# Patient Record
Sex: Female | Born: 1990 | Race: Black or African American | Hispanic: No | Marital: Single | Smoking: Never smoker
Health system: Southern US, Community
[De-identification: ages and names within clinical notes are randomized; demographics above are authoritative.]

## PROBLEM LIST (undated history)

## (undated) ENCOUNTER — Emergency Department (HOSPITAL_COMMUNITY): Admission: EM | Payer: Medicaid Other | Source: Home / Self Care

## (undated) ENCOUNTER — Inpatient Hospital Stay (HOSPITAL_COMMUNITY): Payer: Self-pay

## (undated) DIAGNOSIS — F32A Depression, unspecified: Secondary | ICD-10-CM

## (undated) DIAGNOSIS — O2240 Hemorrhoids in pregnancy, unspecified trimester: Secondary | ICD-10-CM

## (undated) DIAGNOSIS — F329 Major depressive disorder, single episode, unspecified: Secondary | ICD-10-CM

## (undated) DIAGNOSIS — D649 Anemia, unspecified: Secondary | ICD-10-CM

---
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
## 2012-10-18 DIAGNOSIS — O210 Mild hyperemesis gravidarum: Secondary | ICD-10-CM | POA: Insufficient documentation

## 2012-10-19 ENCOUNTER — Encounter (HOSPITAL_COMMUNITY): Payer: Self-pay | Admitting: Emergency Medicine

## 2012-10-19 ENCOUNTER — Emergency Department (HOSPITAL_COMMUNITY)
Admission: EM | Admit: 2012-10-19 | Discharge: 2012-10-19 | Disposition: A | Discharge status: Home / Self Care | Payer: Medicaid Other | Attending: Emergency Medicine | Admitting: Emergency Medicine

## 2012-10-19 DIAGNOSIS — Z3490 Encounter for supervision of normal pregnancy, unspecified, unspecified trimester: Secondary | ICD-10-CM

## 2012-10-19 HISTORY — DX: Anemia, unspecified: D64.9

## 2012-10-19 LAB — URINALYSIS, ROUTINE W REFLEX MICROSCOPIC
Bilirubin Urine: NEGATIVE
Glucose, UA: NEGATIVE mg/dL
Hgb urine dipstick: NEGATIVE
Leukocytes, UA: NEGATIVE
Nitrite: NEGATIVE
Urobilinogen, UA: 0.2 mg/dL (ref 0.0–1.0)

## 2012-10-19 LAB — POCT PREGNANCY, URINE: Preg Test, Ur: POSITIVE — AB

## 2012-10-19 MED ORDER — ONDANSETRON HCL 4 MG PO TABS: 4.0000 mg | ORAL_TABLET | Freq: Four times a day (QID) | ORAL | Status: DC

## 2012-10-19 NOTE — ED Notes (Signed)
Patient complaining of lower right quadrant pain, nausea, and vomiting. Reports took at home pregnancy test Friday night and had positive result.

## 2012-10-19 NOTE — ED Provider Notes (Signed)
History     CSN: [id number]  Arrival date & time 10/18/12  2343   First MD Initiated Contact with Patient 10/19/12 0117      No chief complaint on file.   (Consider location/radiation/quality/duration/timing/severity/associated sxs/prior treatment) HPI  [name] is a 22 y.o. female who presents to the Emergency Department complaining of  Nausea and morning vomiting x 1 week, positive pregnancy test at home on Friday. LMP 09/03/12.Has had intermittent right lower quadrant sharp pains x 1 day. Denies fever, chills, vaginal discharge, bleeding.  Past Medical History  Diagnosis Date  . Anemia     History reviewed. No pertinent past surgical history.  History reviewed. No pertinent family history.  History  Substance Use Topics  . Smoking status: Never Smoker   . Smokeless tobacco: Not on file  . Alcohol Use: No    OB History    Grav Para Term Preterm Abortions TAB SAB Ect Mult Living                  Review of Systems  Constitutional: Negative for fever.       10 Systems reviewed and are negative for acute change except as noted in the HPI.  HENT: Negative for congestion.   Eyes: Negative for discharge and redness.  Respiratory: Negative for cough and shortness of breath.   Cardiovascular: Negative for chest pain.  Gastrointestinal: Positive for nausea and vomiting. Negative for abdominal pain.  Musculoskeletal: Negative for back pain.  Skin: Negative for rash.  Neurological: Negative for syncope, numbness and headaches.  Psychiatric/Behavioral:       No behavior change.    Allergies  Review of patient's allergies indicates no known allergies.  Home Medications  No current outpatient prescriptions on file.  BP 112/67  Pulse 106  Temp 99.1 F (37.3 C) (Oral)  Resp 16  Ht 5\' 2"  (1.575 m)  Wt 150 lb (68.04 kg)  BMI 27.44 kg/m2  SpO2 100%  LMP 09/03/2012  Physical Exam  Nursing note and vitals reviewed. Constitutional: She is oriented to person,  place, and time. She appears well-developed and well-nourished.       Awake, alert, nontoxic appearance.  HENT:  Head: Atraumatic.  Eyes: Right eye exhibits no discharge. Left eye exhibits no discharge.  Neck: Neck supple.  Cardiovascular: Normal heart sounds.   Pulmonary/Chest: Effort normal and breath sounds normal. She exhibits no tenderness.  Abdominal: Soft. Bowel sounds are normal. There is no tenderness. There is no rebound.  Musculoskeletal: Normal range of motion. She exhibits no tenderness.       Baseline ROM, no obvious new focal weakness.  Neurological: She is alert and oriented to person, place, and time. She has normal reflexes.       Mental status and motor strength appears baseline for patient and situation.  Skin: Skin is warm and dry. No rash noted.  Psychiatric: She has a normal mood and affect.    ED Course  Procedures (including critical care time)  Results for orders placed during the hospital encounter of 10/19/12  URINALYSIS, ROUTINE W REFLEX MICROSCOPIC      Component Value Range   Color, Urine YELLOW  YELLOW   APPearance CLEAR  CLEAR   Specific Gravity, Urine <1.005 (*) 1.005 - 1.030   pH 6.5  5.0 - 8.0   Glucose, UA NEGATIVE  NEGATIVE mg/dL   Hgb urine dipstick NEGATIVE  NEGATIVE   Bilirubin Urine NEGATIVE  NEGATIVE   Ketones, ur NEGATIVE  NEGATIVE mg/dL  Protein, ur NEGATIVE  NEGATIVE mg/dL   Urobilinogen, UA 0.2  0.0 - 1.0 mg/dL   Nitrite NEGATIVE  NEGATIVE   Leukocytes, UA NEGATIVE  NEGATIVE  POCT PREGNANCY, URINE      Component Value Range   Preg Test, Ur POSITIVE (*) NEGATIVE       MDM  Patient with nausea and daily vomiting, positive home pregnancy test confirmed here. She is pleased to be pregnant. Referral to OB/GYN for prenatal care.Pt stable in ED with no significant deterioration in condition.The patient appears reasonably screened and/or stabilized for discharge and I doubt any other medical condition or other [facility] requiring further  screening, evaluation, or treatment in the ED at this time prior to discharge.  MDM Reviewed: nursing note and vitals Interpretation: labs           Nicoletta Dress. Colon Branch, MD 10/19/12 [phone]

## 2012-10-26 ENCOUNTER — Emergency Department (HOSPITAL_COMMUNITY): Payer: Medicaid Other

## 2012-10-26 ENCOUNTER — Emergency Department (HOSPITAL_COMMUNITY)
Admission: EM | Admit: 2012-10-26 | Discharge: 2012-10-26 | Disposition: A | Discharge status: Home / Self Care | Payer: Medicaid Other | Attending: Emergency Medicine | Admitting: Emergency Medicine

## 2012-10-26 ENCOUNTER — Encounter (HOSPITAL_COMMUNITY): Payer: Self-pay | Admitting: Emergency Medicine

## 2012-10-26 DIAGNOSIS — O99891 Other specified diseases and conditions complicating pregnancy: Secondary | ICD-10-CM | POA: Insufficient documentation

## 2012-10-26 DIAGNOSIS — Z862 Personal history of diseases of the blood and blood-forming organs and certain disorders involving the immune mechanism: Secondary | ICD-10-CM | POA: Insufficient documentation

## 2012-10-26 DIAGNOSIS — Z3490 Encounter for supervision of normal pregnancy, unspecified, unspecified trimester: Secondary | ICD-10-CM

## 2012-10-26 DIAGNOSIS — R1030 Lower abdominal pain, unspecified: Secondary | ICD-10-CM

## 2012-10-26 DIAGNOSIS — R112 Nausea with vomiting, unspecified: Secondary | ICD-10-CM

## 2012-10-26 DIAGNOSIS — R197 Diarrhea, unspecified: Secondary | ICD-10-CM

## 2012-10-26 LAB — URINE MICROSCOPIC-ADD ON

## 2012-10-26 LAB — URINALYSIS, ROUTINE W REFLEX MICROSCOPIC
Hgb urine dipstick: NEGATIVE
Ketones, ur: >80 mg/dL — AB
Specific Gravity, Urine: >1.03 — ABNORMAL HIGH (ref 1.005–1.030)
pH: 6.5 (ref 5.0–8.0)

## 2012-10-26 LAB — CBC
HCT: 33.7 % — ABNORMAL LOW (ref 36.0–46.0)
Hemoglobin: 11.4 g/dL — ABNORMAL LOW (ref 12.0–15.0)
MCHC: 33.8 g/dL (ref 30.0–36.0)
MCV: 79.5 fL (ref 78.0–100.0)
Platelets: 222 10*3/uL (ref 150–400)

## 2012-10-26 LAB — TYPE AND SCREEN

## 2012-10-26 LAB — WET PREP, GENITAL: Trich, Wet Prep: NONE SEEN

## 2012-10-26 MED ORDER — ONDANSETRON 4 MG PO TBDP
4.0000 mg | ORAL_TABLET | Freq: Once | ORAL | Status: AC
  Administered 2012-10-26: 4 mg via ORAL
  Filled 2012-10-26: qty 1

## 2012-10-26 MED ORDER — ONDANSETRON 4 MG PO TBDP: 4.0000 mg | ORAL_TABLET | Freq: Three times a day (TID) | ORAL | Status: DC | PRN

## 2012-10-26 MED ORDER — HYDROMORPHONE HCL PF 1 MG/ML IJ SOLN
0.5000 mg | Freq: Once | INTRAMUSCULAR | Status: AC
  Administered 2012-10-26: 0.5 mg via INTRAVENOUS
  Filled 2012-10-26: qty 1

## 2012-10-26 MED ORDER — SODIUM CHLORIDE 0.9 % IV BOLUS (SEPSIS)
1000.0000 mL | Freq: Once | INTRAVENOUS | Status: AC
  Administered 2012-10-26: 1000 mL via INTRAVENOUS

## 2012-10-26 NOTE — ED Notes (Signed)
Pt c/o lower abd pain/diarrhea  today with intermittent n/v x 2 weeks. Pt approximately [redacted] weeks pregnant. Pt has not had appt with obgyn.

## 2012-10-26 NOTE — ED Provider Notes (Signed)
History     CSN: [id number]  Arrival date & time 10/26/12  1323   First MD Initiated Contact with Patient 10/26/12 1427      Chief Complaint  Patient presents with  . Emesis  . Abdominal Pain    (Consider location/radiation/quality/duration/timing/severity/associated sxs/prior treatment) HPI [name] is a 22 y.o. female who presents at about 7 weeks pregnancy, she is G1 P0 A0, she's had intermittent nausea and vomiting x2 weeks and today has had 3 episodes of loose stools. She's also had associated abdominal cramping today. She says her pain is 10/10, located in lower belly, she says it is similar to severe periods. There's been no blood in the stools, no black stools. She has not taken anything for this. She was diagnosed as being pregnant in this emergency department and was given a prescription for Zofran which she did not fill. She's not been eating very much today, she denies any dizziness, chest pain, shortness of breath. He also denies any vaginal bleeding, vaginal discharge, or rush of fluid.  Past Medical History  Diagnosis Date  . Anemia     History reviewed. No pertinent past surgical history.  No family history on file.  History  Substance Use Topics  . Smoking status: Never Smoker   . Smokeless tobacco: Not on file  . Alcohol Use: No    OB History    Grav Para Term Preterm Abortions TAB SAB Ect Mult Living                  Review of Systems At least 10pt or greater review of systems completed and are negative except where specified in the HPI.  Allergies  Review of patient's allergies indicates no known allergies.  Home Medications   Current Outpatient Rx  Name Route Sig Dispense Refill  . ONDANSETRON HCL 4 MG PO TABS Oral Take 1 tablet (4 mg total) by mouth every 6 (six) hours. 12 tablet 0    BP 109/55  Pulse 62  Temp 97.9 F (36.6 C) (Oral)  Resp 16  Ht 5\' 2"  (1.575 m)  Wt 150 lb (68.04 kg)  BMI 27.44 kg/m2  SpO2 100%  LMP  09/03/2012  Physical Exam  Nursing notes reviewed.  Electronic medical record reviewed. VITAL SIGNS:   Filed Vitals:   10/26/12 1400 10/26/12 1403 10/26/12 1716  BP:  109/55 110/54  Pulse:  62 79  Temp:  97.9 F (36.6 C)   TempSrc: Oral Oral   Resp:  16 16  Height:  5\' 2"  (1.575 m)   Weight:  150 lb (68.04 kg)   SpO2:  100% 100%   CONSTITUTIONAL: Awake, oriented, appears non-toxic HENT: Atraumatic, normocephalic, oral mucosa pink and moist, airway patent. Nares patent without drainage. External ears normal. EYES: Conjunctiva clear, EOMI, PERRLA NECK: Trachea midline, non-tender, supple CARDIOVASCULAR: Normal heart rate, Normal rhythm, No murmurs, rubs, gallops PULMONARY/CHEST: Clear to auscultation, no rhonchi, wheezes, or rales. Symmetrical breath sounds. Non-tender. ABDOMINAL: Non-distended, soft, non-tender - no rebound or guarding.  BS normal. NEUROLOGIC: Non-focal, moving all four extremities, no gross sensory or motor deficits. EXTREMITIES: No clubbing, cyanosis, or edema SKIN: Warm, Dry, No erythema, No rash PELVIC EXAM: normal external genitalia, vulva, vagina, cervix, uterus and adnexa. ED Course  Procedures (including critical care time)  Labs Reviewed  URINALYSIS, ROUTINE W REFLEX MICROSCOPIC - Abnormal; Notable for the following:    Specific Gravity, Urine >1.030 (*)     Bilirubin Urine SMALL (*)  Ketones, ur >80 (*)     Protein, ur 30 (*)     All other components within normal limits  CBC - Abnormal; Notable for the following:    Hemoglobin 11.4 (*)     HCT 33.7 (*)     All other components within normal limits  WET PREP, GENITAL - Abnormal; Notable for the following:    Clue Cells Wet Prep HPF POC FEW (*)     WBC, Wet Prep HPF POC FEW (*)     All other components within normal limits  POCT PREGNANCY, URINE - Abnormal; Notable for the following:    Preg Test, Ur POSITIVE (*)     All other components within normal limits  URINE MICROSCOPIC-ADD ON -  Abnormal; Notable for the following:    Squamous Epithelial / LPF MANY (*)     Bacteria, UA FEW (*)     All other components within normal limits  TYPE AND SCREEN  GC/CHLAMYDIA PROBE AMP, GENITAL  URINE CULTURE   US Ob Comp Less 14 Wks  10/26/2012  *RADIOLOGY REPORT*  Clinical Data: First trimester pregnancy.  Abdominal pain and emesis.  OBSTETRIC <14 WK [place] AND TRANSVAGINAL OB US  Technique:  Both transabdominal and transvaginal ultrasound examinations were performed for complete evaluation of the gestation as well as the maternal uterus, adnexal regions, and pelvic cul-de-sac.  Transvaginal technique was performed to assess early pregnancy.  Comparison:  None.  Intrauterine gestational sac:  Visualized/normal in shape. Yolk sac: Visualized. Unfused amnion noted. Embryo: Visualized. Cardiac Activity: Visualized. Heart Rate: 163 bpm  CRL: 15.8  mm  ; 8-week-0-day.            [place] EDC: 06/07/2013  Maternal uterus/adnexae: No evidence of subchorionic hematoma or significant free pelvic fluid.  Both maternal ovaries are visualized.  There is no adnexal mass.  IMPRESSION:  1.  Single live intrauterine gestation with best estimated gestational age of 8-week-0-day. 2.  No fetal, placental or adnexal abnormalities identified.   Original Report Authenticated By: Gerrianne Scale, M.D.    US Ob Transvaginal  10/26/2012  *RADIOLOGY REPORT*  Clinical Data: First trimester pregnancy.  Abdominal pain and emesis.  OBSTETRIC <14 WK [place] AND TRANSVAGINAL OB US  Technique:  Both transabdominal and transvaginal ultrasound examinations were performed for complete evaluation of the gestation as well as the maternal uterus, adnexal regions, and pelvic cul-de-sac.  Transvaginal technique was performed to assess early pregnancy.  Comparison:  None.  Intrauterine gestational sac:  Visualized/normal in shape. Yolk sac: Visualized. Unfused amnion noted. Embryo: Visualized. Cardiac Activity: Visualized. Heart Rate: 163 bpm  CRL:  15.8  mm  ; 8-week-0-day.            [place] EDC: 06/07/2013  Maternal uterus/adnexae: No evidence of subchorionic hematoma or significant free pelvic fluid.  Both maternal ovaries are visualized.  There is no adnexal mass.  IMPRESSION:  1.  Single live intrauterine gestation with best estimated gestational age of 8-week-0-day. 2.  No fetal, placental or adnexal abnormalities identified.   Original Report Authenticated By: Gerrianne Scale, M.D.      1. Lower abdominal pain   2. Normal pregnancy   3. Diarrhea   4. Nausea & vomiting     Medications  ondansetron (ZOFRAN ODT) 4 MG disintegrating tablet (not administered)  ondansetron (ZOFRAN-ODT) disintegrating tablet 4 mg (4 mg Oral Given 10/26/12 1501)  HYDROmorphone (DILAUDID) injection 0.5 mg (0.5 mg Intravenous Given 10/26/12 1633)  sodium chloride 0.9 % bolus  1,000 mL (0 mL Intravenous Stopped 10/26/12 1731)      MDM  Janet Veasey is a 22 y.o. female presenting with likely exacerbation of her morning sickness secondary to pregnancy however she's also had 3 episodes of loose stools - she's had 3 today and she may be mildly dehydrated. May also be gastroenteritis. Without any bleeding or vaginal complaints, do not think this is an abortion. Pelvic exam shows the cervical os closed, no blood in the vault.  Patient has questionable followup since she is "waiting to get Medicaid" so we'll perform ultrasound here to ensure fetal viability - in addition patient's pain was acute onset in her lower abdomen and I think we need to rule out ovarian torsion, TOA, heterotopic/ectopic pregnancy.  Labs are unremarkable, patient is not anemic, urinalysis is consistent with mild dehydration, many squamous epithelial cells consistent with a contaminated sample-white blood cells 11-20 however she has no symptoms of urinary tract infection. Only few bacteria were seen- I do not think this represents an asymptomatic bacteria, and this is likely related to mild  dehydration and insufficient flushing of the urethra.   Ultrasound shows a single live intrauterine gestation gestational age about 8 weeks.  Patient's pain is gone, she is no longer nauseous, we'll send her another prescription for Zofran-this time ODT, given her resources to followup with prenatal care with the [facility] clinic. I have strongly advised her to get her insurance affairs in order or to workout pain and with local OB/GYN. Also advised to start taking prenatal vitamins to include folic acid.  I explained the diagnosis and have given explicit precautions to return to the ER including vaginal bleeding, worsening vaginal pain, fevers or chills, rush of fluid or any other new or worsening symptoms. The patient understands and accepts the medical plan as it's been dictated and I have answered their questions. Discharge instructions concerning home care and prescriptions have been given.  The patient is STABLE and is discharged to home in good condition.             Jones Skene, MD 10/27/12 2336

## 2012-10-26 NOTE — ED Notes (Signed)
Pt states she feels "much better" 

## 2012-10-27 LAB — URINE CULTURE: Colony Count: 8000

## 2012-10-27 LAB — GC/CHLAMYDIA PROBE AMP, GENITAL: Chlamydia, DNA Probe: NEGATIVE

## 2012-11-18 ENCOUNTER — Encounter (HOSPITAL_COMMUNITY): Payer: Self-pay | Admitting: *Deleted

## 2012-12-31 NOTE — L&D Delivery Note (Signed)
Delivery Note At 2:04 AM a viable female was delivered via Vaginal, Spontaneous Delivery (Presentation: ; Occiput Anterior).  APGAR: 8, 9; weight pending.   Placenta status: Intact, Spontaneous.  Cord:  3-vessel with the following complications: none.  Cord pH: n/a  Anesthesia: Epidural  Episiotomy:  Lacerations: none Suture Repair:  n/a Est. Blood Loss (mL):   Mom to postpartum.  Baby to nursery-stable.  Napoleon Form 05/19/2013, 2:17 AM

## 2013-01-04 ENCOUNTER — Emergency Department (HOSPITAL_COMMUNITY)
Admission: EM | Admit: 2013-01-04 | Discharge: 2013-01-04 | Payer: Medicaid Other | Attending: Emergency Medicine | Admitting: Emergency Medicine

## 2013-01-04 ENCOUNTER — Encounter (HOSPITAL_COMMUNITY): Payer: Self-pay | Admitting: *Deleted

## 2013-01-04 DIAGNOSIS — O9989 Other specified diseases and conditions complicating pregnancy, childbirth and the puerperium: Secondary | ICD-10-CM | POA: Insufficient documentation

## 2013-01-04 DIAGNOSIS — S46909A Unspecified injury of unspecified muscle, fascia and tendon at shoulder and upper arm level, unspecified arm, initial encounter: Secondary | ICD-10-CM | POA: Insufficient documentation

## 2013-01-04 DIAGNOSIS — S4980XA Other specified injuries of shoulder and upper arm, unspecified arm, initial encounter: Secondary | ICD-10-CM | POA: Insufficient documentation

## 2013-01-04 DIAGNOSIS — M25519 Pain in unspecified shoulder: Secondary | ICD-10-CM

## 2013-01-04 DIAGNOSIS — S8000XA Contusion of unspecified knee, initial encounter: Secondary | ICD-10-CM | POA: Insufficient documentation

## 2013-01-04 DIAGNOSIS — Z3490 Encounter for supervision of normal pregnancy, unspecified, unspecified trimester: Secondary | ICD-10-CM

## 2013-01-04 MED ORDER — HYDROCODONE-ACETAMINOPHEN 5-325 MG PO TABS: 1.0000 | ORAL_TABLET | Freq: Four times a day (QID) | ORAL | Status: DC | PRN

## 2013-01-04 NOTE — ED Notes (Signed)
RN given message to call [employer] regarding an outstanding warrant for arrest on pt. Informed [employer] pt was in Fast track room 6.
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
## 2013-02-10 ENCOUNTER — Inpatient Hospital Stay (HOSPITAL_COMMUNITY)
Admission: AD | Admit: 2013-02-10 | Discharge: 2013-02-10 | Disposition: A | Discharge status: Home / Self Care | Payer: Medicaid Other | Source: Ambulatory Visit | Attending: Obstetrics & Gynecology | Admitting: Obstetrics & Gynecology

## 2013-02-10 ENCOUNTER — Encounter (HOSPITAL_COMMUNITY): Payer: Self-pay

## 2013-02-10 DIAGNOSIS — O2390 Unspecified genitourinary tract infection in pregnancy, unspecified trimester: Secondary | ICD-10-CM | POA: Insufficient documentation

## 2013-02-10 DIAGNOSIS — B9689 Other specified bacterial agents as the cause of diseases classified elsewhere: Secondary | ICD-10-CM

## 2013-02-10 DIAGNOSIS — N760 Acute vaginitis: Secondary | ICD-10-CM

## 2013-02-10 DIAGNOSIS — O0930 Supervision of pregnancy with insufficient antenatal care, unspecified trimester: Secondary | ICD-10-CM | POA: Insufficient documentation

## 2013-02-10 DIAGNOSIS — R109 Unspecified abdominal pain: Secondary | ICD-10-CM | POA: Insufficient documentation

## 2013-02-10 DIAGNOSIS — A499 Bacterial infection, unspecified: Secondary | ICD-10-CM

## 2013-02-10 DIAGNOSIS — O0932 Supervision of pregnancy with insufficient antenatal care, second trimester: Secondary | ICD-10-CM

## 2013-02-10 LAB — URINALYSIS, ROUTINE W REFLEX MICROSCOPIC
Glucose, UA: NEGATIVE mg/dL
Hgb urine dipstick: NEGATIVE
Leukocytes, UA: NEGATIVE
Protein, ur: NEGATIVE mg/dL
Specific Gravity, Urine: 1.015 (ref 1.005–1.030)

## 2013-02-10 LAB — WET PREP, GENITAL
Trich, Wet Prep: NONE SEEN
Yeast Wet Prep HPF POC: NONE SEEN

## 2013-02-10 MED ORDER — METRONIDAZOLE 500 MG PO TABS: 500.0000 mg | ORAL_TABLET | Freq: Two times a day (BID) | ORAL | Status: AC

## 2013-02-10 NOTE — MAU Provider Note (Signed)
Attestation of Attending Supervision of Obstetric Fellow: Evaluation and management procedures were performed by the Obstetric Fellow under my supervision and collaboration.  I have reviewed the Obstetric Fellow's note and chart, and I agree with the management and plan.  Kriti Katayama, MD, FACOG Attending Obstetrician & Gynecologist Faculty Practice, [facility]    

## 2013-02-10 NOTE — MAU Provider Note (Signed)
History     CSN: [id number]  Arrival date and time: 02/10/13 1652   First Provider Initiated Contact with Patient 02/10/13 1737      Chief Complaint  Patient presents with  . Abdominal Cramping   HPI 23 y.o. G1P0 at [redacted]w[redacted]d with lower abdominal cramping for about a week. No bleeding or discharge. No prenatal care to date but has Medicaid. Feeling baby move. No dysuria, fever or chills.  OB History   Grav Para Term Preterm Abortions TAB SAB Ect Mult Living   1               Past Medical History  Diagnosis Date  . Anemia     History reviewed. No pertinent past surgical history.  History reviewed. No pertinent family history.  History  Substance Use Topics  . Smoking status: Never Smoker   . Smokeless tobacco: Not on file  . Alcohol Use: No    Allergies: No Known Allergies  Prescriptions prior to admission  Medication Sig Dispense Refill  . acetaminophen (TYLENOL) 500 MG tablet Take 1,000 mg by mouth daily as needed. For pain      . Prenatal Vit-Fe Fumarate-FA (PRENATAL MULTIVITAMIN) TABS Take 2 tablets by mouth daily.      . [DISCONTINUED] HYDROcodone-acetaminophen (NORCO/VICODIN) 5-325 MG per tablet Take 1-2 tablets by mouth every 6 (six) hours as needed for pain.  7 tablet  0    Review of Systems  Constitutional: Negative for fever and chills.  Eyes: Negative for blurred vision and double vision.  Respiratory: Negative for cough.   Cardiovascular: Negative for chest pain and palpitations.  Gastrointestinal: Positive for abdominal pain. Negative for nausea, vomiting, diarrhea and constipation.  Genitourinary: Negative for dysuria and urgency.  Musculoskeletal: Negative for back pain.  Neurological: Positive for headaches (mild occasional, relieved by tylenol). Negative for dizziness.   Physical Exam   Blood pressure 119/64, pulse 101, temperature 98 F (36.7 C), temperature source Oral, resp. rate 18, last menstrual period 09/03/2012.  Physical Exam   Constitutional: She is oriented to person, place, and time. She appears well-developed and well-nourished. No distress.  HENT:  Head: Normocephalic and atraumatic.  Eyes: Conjunctivae and EOM are normal.  Neck: Normal range of motion. Neck supple.  Cardiovascular: Normal rate, regular rhythm and normal heart sounds.   Respiratory: Effort normal and breath sounds normal. No respiratory distress.  GI: Soft. Bowel sounds are normal. There is no tenderness. There is no rebound and no guarding.  Gravid: size appropriate for dates  Genitourinary:  Normal external genitalia, normal vagina. Frothy yellow discharge in vagina. Cervix normal, nonparous, long, no CMT. No adnexal tenderness.  Musculoskeletal: Normal range of motion. She exhibits no edema and no tenderness.  Neurological: She is alert and oriented to person, place, and time.  Skin: Skin is warm and dry.  Psychiatric: She has a normal mood and affect.   FHTs:  164 by doppler  Results for orders placed during the hospital encounter of 02/10/13 (from the past 24 hour(s))  URINALYSIS, ROUTINE W REFLEX MICROSCOPIC     Status: None   Collection Time    02/10/13  5:05 PM      Result Value Range   Color, Urine YELLOW  YELLOW   APPearance CLEAR  CLEAR   Specific Gravity, Urine 1.015  1.005 - 1.030   pH 7.0  5.0 - 8.0   Glucose, UA NEGATIVE  NEGATIVE mg/dL   Hgb urine dipstick NEGATIVE  NEGATIVE   Bilirubin Urine  NEGATIVE  NEGATIVE   Ketones, ur NEGATIVE  NEGATIVE mg/dL   Protein, ur NEGATIVE  NEGATIVE mg/dL   Urobilinogen, UA 0.2  0.0 - 1.0 mg/dL   Nitrite NEGATIVE  NEGATIVE   Leukocytes, UA NEGATIVE  NEGATIVE  WET PREP, GENITAL     Status: Abnormal   Collection Time    02/10/13  5:45 PM      Result Value Range   Yeast Wet Prep HPF POC NONE SEEN  NONE SEEN   Trich, Wet Prep NONE SEEN  NONE SEEN   Clue Cells Wet Prep HPF POC MODERATE (*) NONE SEEN   WBC, Wet Prep HPF POC FEW (*) NONE SEEN    MAU Course   Procedures    Assessment and Plan  22 y.o. G1P0 at [redacted]w[redacted]d by 8 week sono with - Crampy lower abdominal pain:  BV, round ligament. Metronidazole - No prenatal care:  Refer for OP ultrasound for growth/anatomy - Has medicaid, needs to be set up for prenatal care. Over 20 weeks - will need to go to [facility].  Patient left before receiving instructions or results.  Napoleon Form, MD 02/10/2013 6:57 PM   Napoleon Form 02/10/2013, 6:52 PM

## 2013-02-10 NOTE — MAU Note (Signed)
Patient is in with c/o intermittent cramping, hemorrhoids and no prenatal care. She denies n/v, vaginal bleeding or abnormal vaginal bleeding.

## 2013-02-11 LAB — GC/CHLAMYDIA PROBE AMP: CT Probe RNA: NEGATIVE

## 2013-03-02 ENCOUNTER — Encounter: Payer: Self-pay | Admitting: *Deleted

## 2013-03-02 ENCOUNTER — Ambulatory Visit (INDEPENDENT_AMBULATORY_CARE_PROVIDER_SITE_OTHER): Payer: Medicaid Other | Admitting: Advanced Practice Midwife

## 2013-03-02 ENCOUNTER — Other Ambulatory Visit: Payer: Self-pay | Admitting: Obstetrics & Gynecology

## 2013-03-02 ENCOUNTER — Encounter: Payer: Self-pay | Admitting: Advanced Practice Midwife

## 2013-03-02 VITALS — BP 109/71 | Temp 98.9°F | Wt 165.2 lb

## 2013-03-02 DIAGNOSIS — Z3400 Encounter for supervision of normal first pregnancy, unspecified trimester: Secondary | ICD-10-CM

## 2013-03-02 DIAGNOSIS — O0930 Supervision of pregnancy with insufficient antenatal care, unspecified trimester: Secondary | ICD-10-CM

## 2013-03-02 DIAGNOSIS — Z3402 Encounter for supervision of normal first pregnancy, second trimester: Secondary | ICD-10-CM

## 2013-03-02 DIAGNOSIS — Z23 Encounter for immunization: Secondary | ICD-10-CM

## 2013-03-02 DIAGNOSIS — O0932 Supervision of pregnancy with insufficient antenatal care, second trimester: Secondary | ICD-10-CM

## 2013-03-02 LAB — POCT URINALYSIS DIP (DEVICE)
Bilirubin Urine: NEGATIVE
Hgb urine dipstick: NEGATIVE
Ketones, ur: NEGATIVE mg/dL
Nitrite: NEGATIVE
Protein, ur: NEGATIVE mg/dL
pH: 8.5 — ABNORMAL HIGH (ref 5.0–8.0)

## 2013-03-02 MED ORDER — TETANUS-DIPHTH-ACELL PERTUSSIS 5-2.5-18.5 LF-MCG/0.5 IM SUSP
0.5000 mL | Freq: Once | INTRAMUSCULAR | Status: AC
  Administered 2013-03-02: 0.5 mL via INTRAMUSCULAR

## 2013-03-02 NOTE — Progress Notes (Signed)
P=92, c/o a little edema in fingers only, c/o pain and pressure in right side of abdomen, Given new patient information. Discussed weight gain. Declines flu shot today, but requests tdap.

## 2013-03-02 NOTE — Progress Notes (Signed)
U/S scheduled 03/05/13 at 8 am.

## 2013-03-02 NOTE — Patient Instructions (Addendum)
Pregnancy - Second Trimester The second trimester of pregnancy (3 to 6 months) is a period of rapid growth for you and your baby. At the end of the sixth month, your baby is about 9 inches long and weighs 1 1/2 pounds. You will begin to feel the baby move between 18 and 20 weeks of the pregnancy. This is called quickening. Weight gain is faster. A clear fluid (colostrum) may leak out of your breasts. You may feel small contractions of the womb (uterus). This is known as false labor or Braxton-Hicks contractions. This is like a practice for labor when the baby is ready to be born. Usually, the problems with morning sickness have usually passed by the end of your first trimester. Some women develop small dark blotches (called cholasma, mask of pregnancy) on their face that usually goes away after the baby is born. Exposure to the sun makes the blotches worse. Acne may also develop in some pregnant women and pregnant women who have acne, may find that it goes away. PRENATAL EXAMS  Blood work may continue to be done during prenatal exams. These tests are done to check on your health and the probable health of your baby. Blood work is used to follow your blood levels (hemoglobin). Anemia (low hemoglobin) is common during pregnancy. Iron and vitamins are given to help prevent this. You will also be checked for diabetes between 24 and 28 weeks of the pregnancy. Some of the previous blood tests may be repeated.  The size of the uterus is measured during each visit. This is to make sure that the baby is continuing to grow properly according to the dates of the pregnancy.  Your blood pressure is checked every prenatal visit. This is to make sure you are not getting toxemia.  Your urine is checked to make sure you do not have an infection, diabetes or protein in the urine.  Your weight is checked often to make sure gains are happening at the suggested rate. This is to ensure that both you and your baby are growing  normally.  Sometimes, an ultrasound is performed to confirm the proper growth and development of the baby. This is a test which bounces harmless sound waves off the baby so your caregiver can more accurately determine due dates. Sometimes, a specialized test is done on the amniotic fluid surrounding the baby. This test is called an amniocentesis. The amniotic fluid is obtained by sticking a needle into the belly (abdomen). This is done to check the chromosomes in instances where there is a concern about possible genetic problems with the baby. It is also sometimes done near the end of pregnancy if an early delivery is required. In this case, it is done to help make sure the baby's lungs are mature enough for the baby to live outside of the womb. CHANGES OCCURING IN THE SECOND TRIMESTER OF PREGNANCY Your body goes through many changes during pregnancy. They vary from person to person. Talk to your caregiver about changes you notice that you are concerned about.  During the second trimester, you will likely have an increase in your appetite. It is normal to have cravings for certain foods. This varies from person to person and pregnancy to pregnancy.  Your lower abdomen will begin to bulge.  You may have to urinate more often because the uterus and baby are pressing on your bladder. It is also common to get more bladder infections during pregnancy (pain with urination). You can help this by   drinking lots of fluids and emptying your bladder before and after intercourse.  You may begin to get stretch marks on your hips, abdomen, and breasts. These are normal changes in the body during pregnancy. There are no exercises or medications to take that prevent this change.  You may begin to develop swollen and bulging veins (varicose veins) in your legs. Wearing support hose, elevating your feet for 15 minutes, 3 to 4 times a day and limiting salt in your diet helps lessen the problem.  Heartburn may develop  as the uterus grows and pushes up against the stomach. Antacids recommended by your caregiver helps with this problem. Also, eating smaller meals 4 to 5 times a day helps.  Constipation can be treated with a stool softener or adding bulk to your diet. Drinking lots of fluids, vegetables, fruits, and whole grains are helpful.  Exercising is also helpful. If you have been very active up until your pregnancy, most of these activities can be continued during your pregnancy. If you have been less active, it is helpful to start an exercise program such as walking.  Hemorrhoids (varicose veins in the rectum) may develop at the end of the second trimester. Warm sitz baths and hemorrhoid cream recommended by your caregiver helps hemorrhoid problems.  Backaches may develop during this time of your pregnancy. Avoid heavy lifting, wear low heal shoes and practice good posture to help with backache problems.  Some pregnant women develop tingling and numbness of their hand and fingers because of swelling and tightening of ligaments in the wrist (carpel tunnel syndrome). This goes away after the baby is born.  As your breasts enlarge, you may have to get a bigger bra. Get a comfortable, cotton, support bra. Do not get a nursing bra until the last month of the pregnancy if you will be nursing the baby.  You may get a dark line from your belly button to the pubic area called the linea nigra.  You may develop rosy cheeks because of increase blood flow to the face.  You may develop spider looking lines of the face, neck, arms and chest. These go away after the baby is born. HOME CARE INSTRUCTIONS   It is extremely important to avoid all smoking, herbs, alcohol, and unprescribed drugs during your pregnancy. These chemicals affect the formation and growth of the baby. Avoid these chemicals throughout the pregnancy to ensure the delivery of a healthy infant.  Most of your home care instructions are the same as  suggested for the first trimester of your pregnancy. Keep your caregiver's appointments. Follow your caregiver's instructions regarding medication use, exercise and diet.  During pregnancy, you are providing food for you and your baby. Continue to eat regular, well-balanced meals. Choose foods such as meat, fish, milk and other low fat dairy products, vegetables, fruits, and whole-grain breads and cereals. Your caregiver will tell you of the ideal weight gain.  A physical sexual relationship may be continued up until near the end of pregnancy if there are no other problems. Problems could include early (premature) leaking of amniotic fluid from the membranes, vaginal bleeding, abdominal pain, or other medical or pregnancy problems.  Exercise regularly if there are no restrictions. Check with your caregiver if you are unsure of the safety of some of your exercises. The greatest weight gain will occur in the last 2 trimesters of pregnancy. Exercise will help you:  Control your weight.  Get you in shape for labor and delivery.  Lose weight   after you have the baby.  Wear a good support or jogging bra for breast tenderness during pregnancy. This may help if worn during sleep. Pads or tissues may be used in the bra if you are leaking colostrum.  Do not use hot tubs, steam rooms or saunas throughout the pregnancy.  Wear your seat belt at all times when driving. This protects you and your baby if you are in an accident.  Avoid raw meat, uncooked cheese, cat litter boxes and soil used by cats. These carry germs that can cause birth defects in the baby.  The second trimester is also a good time to visit your dentist for your dental health if this has not been done yet. Getting your teeth cleaned is OK. Use a soft toothbrush. Brush gently during pregnancy.  It is easier to loose urine during pregnancy. Tightening up and strengthening the pelvic muscles will help with this problem. Practice stopping your  urination while you are going to the bathroom. These are the same muscles you need to strengthen. It is also the muscles you would use as if you were trying to stop from passing gas. You can practice tightening these muscles up 10 times a set and repeating this about 3 times per day. Once you know what muscles to tighten up, do not perform these exercises during urination. It is more likely to contribute to an infection by backing up the urine.  Ask for help if you have financial, counseling or nutritional needs during pregnancy. Your caregiver will be able to offer counseling for these needs as well as refer you for other special needs.  Your skin may become oily. If so, wash your face with mild soap, use non-greasy moisturizer and oil or cream based makeup. MEDICATIONS AND DRUG USE IN PREGNANCY  Take prenatal vitamins as directed. The vitamin should contain 1 milligram of folic acid. Keep all vitamins out of reach of children. Only a couple vitamins or tablets containing iron may be fatal to a baby or young child when ingested.  Avoid use of all medications, including herbs, over-the-counter medications, not prescribed or suggested by your caregiver. Only take over-the-counter or prescription medicines for pain, discomfort, or fever as directed by your caregiver. Do not use aspirin.  Let your caregiver also know about herbs you may be using.  Alcohol is related to a number of birth defects. This includes fetal alcohol syndrome. All alcohol, in any form, should be avoided completely. Smoking will cause low birth rate and premature babies.  Street or illegal drugs are very harmful to the baby. They are absolutely forbidden. A baby born to an addicted mother will be addicted at birth. The baby will go through the same withdrawal an adult does. SEEK MEDICAL CARE IF:  You have any concerns or worries during your pregnancy. It is better to call with your questions if you feel they cannot wait, rather  than worry about them. SEEK IMMEDIATE MEDICAL CARE IF:   An unexplained oral temperature above 102 F (38.9 C) develops, or as your caregiver suggests.  You have leaking of fluid from the vagina (birth canal). If leaking membranes are suspected, take your temperature and tell your caregiver of this when you call.  There is vaginal spotting, bleeding, or passing clots. Tell your caregiver of the amount and how many pads are used. Light spotting in pregnancy is common, especially following intercourse.  You develop a bad smelling vaginal discharge with a change in the color from clear   to white.  You continue to feel sick to your stomach (nauseated) and have no relief from remedies suggested. You vomit blood or coffee ground-like materials.  You lose more than 2 pounds of weight or gain more than 2 pounds of weight over 1 week, or as suggested by your caregiver.  You notice swelling of your face, hands, feet, or legs.  You get exposed to German measles and have never had them.  You are exposed to fifth disease or chickenpox.  You develop belly (abdominal) pain. Round ligament discomfort is a common non-cancerous (benign) cause of abdominal pain in pregnancy. Your caregiver still must evaluate you.  You develop a bad headache that does not go away.  You develop fever, diarrhea, pain with urination, or shortness of breath.  You develop visual problems, blurry, or double vision.  You fall or are in a car accident or any kind of trauma.  There is mental or physical violence at home. Document Released: 12/11/2001 Document Revised: 03/10/2012 Document Reviewed: 06/15/2009 [employer] Patient Information 2013 [employer].  

## 2013-03-03 LAB — OBSTETRIC PANEL
Antibody Screen: NEGATIVE
Basophils Absolute: 0 10*3/uL (ref 0.0–0.1)
Basophils Relative: 0 % (ref 0–1)
Eosinophils Absolute: 0.1 10*3/uL (ref 0.0–0.7)
HCT: 29.5 % — ABNORMAL LOW (ref 36.0–46.0)
Hemoglobin: 9.8 g/dL — ABNORMAL LOW (ref 12.0–15.0)
MCH: 26.8 pg (ref 26.0–34.0)
MCHC: 33.2 g/dL (ref 30.0–36.0)
Monocytes Absolute: 0.6 10*3/uL (ref 0.1–1.0)
Monocytes Relative: 5 % (ref 3–12)
RDW: 14.3 % (ref 11.5–15.5)
Rh Type: POSITIVE

## 2013-03-03 LAB — GLUCOSE TOLERANCE, 1 HOUR (50G) W/O FASTING: Glucose, 1 Hour GTT: 77 mg/dL (ref 70–140)

## 2013-03-03 LAB — HIV ANTIBODY (ROUTINE TESTING W REFLEX): HIV: NONREACTIVE

## 2013-03-04 LAB — HEMOGLOBINOPATHY EVALUATION
Hemoglobin Other: 0 %
Hgb A: 97.2 % (ref 96.8–97.8)

## 2013-03-05 ENCOUNTER — Ambulatory Visit (HOSPITAL_COMMUNITY)
Admission: RE | Admit: 2013-03-05 | Discharge: 2013-03-05 | Disposition: A | Discharge status: Home / Self Care | Payer: Medicaid Other | Source: Ambulatory Visit | Attending: Family Medicine | Admitting: Family Medicine

## 2013-03-05 DIAGNOSIS — O0932 Supervision of pregnancy with insufficient antenatal care, second trimester: Secondary | ICD-10-CM

## 2013-03-05 DIAGNOSIS — Z363 Encounter for antenatal screening for malformations: Secondary | ICD-10-CM | POA: Insufficient documentation

## 2013-03-05 DIAGNOSIS — O358XX0 Maternal care for other (suspected) fetal abnormality and damage, not applicable or unspecified: Secondary | ICD-10-CM | POA: Insufficient documentation

## 2013-03-05 DIAGNOSIS — Z1389 Encounter for screening for other disorder: Secondary | ICD-10-CM | POA: Insufficient documentation

## 2013-03-05 DIAGNOSIS — O0930 Supervision of pregnancy with insufficient antenatal care, unspecified trimester: Secondary | ICD-10-CM | POA: Insufficient documentation

## 2013-03-06 ENCOUNTER — Other Ambulatory Visit: Payer: Self-pay | Admitting: Advanced Practice Midwife

## 2013-03-06 DIAGNOSIS — O0932 Supervision of pregnancy with insufficient antenatal care, second trimester: Secondary | ICD-10-CM | POA: Insufficient documentation

## 2013-03-06 DIAGNOSIS — Z3400 Encounter for supervision of normal first pregnancy, unspecified trimester: Secondary | ICD-10-CM | POA: Insufficient documentation

## 2013-03-06 MED ORDER — FERROUS SULFATE 325 (65 FE) MG PO TABS: 325.0000 mg | ORAL_TABLET | Freq: Three times a day (TID) | ORAL | Status: DC

## 2013-03-06 NOTE — Progress Notes (Signed)
   Subjective:    [name] is a G1P0 [redacted]w[redacted]d being seen today for her first obstetrical visit. Patient does intend to breast feed. Pregnancy history fully reviewed.  Patient reports mild swelling in fingers, some pressure in lower right abdomen with movement.  Filed Vitals:   03/02/13 1010  BP: 109/71  Temp: 98.9 F (37.2 C)  Weight: 165 lb 3.2 oz (74.934 kg)    HISTORY: OB History   Grav Para Term Preterm Abortions TAB SAB Ect Mult Living   1              # Outc Date GA Lbr Len/2nd Wgt Sex Del Anes PTL Lv   1 CUR              Past Medical History  Diagnosis Date  . Anemia    Past Surgical History  Procedure Laterality Date  . Wisdom tooth extraction  2011   Family History  Problem Relation Age of Onset  . Asthma Sister   . Asthma Brother      Exam    Uterus:   25 week size  Pelvic Exam: Deferred, recent pelvic in MAU, not due for pap  System: Breast:  normal appearance, no masses or tenderness   Skin: normal coloration and turgor, no rashes    Neurologic: oriented, normal   Extremities: normal strength, tone, and muscle mass       Mouth/Teeth dental hygiene good       Cardiovascular: regular rate and rhythm   Respiratory:  appears well, vitals normal, no respiratory distress, acyanotic, normal RR   Abdomen: soft, non-tender; bowel sounds normal; no masses,  no organomegaly    Assessment:    Pregnancy: G1P0 Patient Active Problem List  Diagnosis  . Supervision of normal intrauterine pregnancy in primigravida        Plan:     Initial labs drawn. 1 hour GCT today. Prenatal vitamins. Problem list reviewed and updated. Genetic Screening: too late  Ultrasound discussed; fetal survey: ordered.  Follow up in 4 weeks.  FRAZIER,NATALIE 03/06/2013

## 2013-03-10 NOTE — Progress Notes (Signed)
Called Ranee's mother @ [phone] as there is no tel # for [place]. Message left to have [place] call our office.

## 2013-03-12 NOTE — Progress Notes (Signed)
Called patient at listed home number- message stated the [employer] customer you are trying to call is unavailable either because the phone is off or outside of the service area.

## 2013-03-16 ENCOUNTER — Telehealth: Payer: Self-pay | Admitting: General Practice

## 2013-03-16 NOTE — Progress Notes (Signed)
Called patient, heard message "the [employer] customer you are trying to reach is unavailable either because the phone is off or because they are outside of the service area, please try your call again later"

## 2013-03-16 NOTE — Telephone Encounter (Deleted)
Called patient, heard message "the [employer] customer you are trying to reach is unavailable either because the phone is off or because they are outside of the service area, please try your call again later"  

## 2013-03-17 ENCOUNTER — Encounter: Payer: Self-pay | Admitting: *Deleted

## 2013-03-17 NOTE — Progress Notes (Signed)
Certified letter sent to pt regarding test result indicating anemia and need for supplemental iron.

## 2013-03-25 NOTE — Telephone Encounter (Signed)
Opened in error

## 2013-03-30 ENCOUNTER — Ambulatory Visit (INDEPENDENT_AMBULATORY_CARE_PROVIDER_SITE_OTHER): Payer: Medicaid Other | Admitting: Obstetrics and Gynecology

## 2013-03-30 VITALS — BP 122/75 | Wt 173.4 lb

## 2013-03-30 DIAGNOSIS — J069 Acute upper respiratory infection, unspecified: Secondary | ICD-10-CM

## 2013-03-30 DIAGNOSIS — O0930 Supervision of pregnancy with insufficient antenatal care, unspecified trimester: Secondary | ICD-10-CM

## 2013-03-30 DIAGNOSIS — D509 Iron deficiency anemia, unspecified: Secondary | ICD-10-CM

## 2013-03-30 LAB — POCT URINALYSIS DIP (DEVICE)
Hgb urine dipstick: NEGATIVE
Ketones, ur: NEGATIVE mg/dL
Protein, ur: NEGATIVE mg/dL
Specific Gravity, Urine: 1.02 (ref 1.005–1.030)
pH: 7 (ref 5.0–8.0)

## 2013-03-30 NOTE — Progress Notes (Signed)
Has URI and nonproductive cough. Sleepy from cold med. Lungs CTA. Neosynephrine nose gtts. Rx. Not yet on iron> start. Has quit work. Vaginal nonirritative.  Recommended PN classes.

## 2013-03-30 NOTE — Progress Notes (Signed)
Pulse: 97 Had thin milky discharge a few days ago.

## 2013-04-13 ENCOUNTER — Encounter: Payer: Medicaid Other | Admitting: Family Medicine

## 2013-04-14 ENCOUNTER — Encounter: Payer: Self-pay | Admitting: Family Medicine

## 2013-04-20 ENCOUNTER — Encounter: Payer: Self-pay | Admitting: Sports Medicine

## 2013-04-20 ENCOUNTER — Inpatient Hospital Stay (HOSPITAL_COMMUNITY)
Admission: AD | Admit: 2013-04-20 | Discharge: 2013-04-20 | Disposition: A | Discharge status: Home / Self Care | Payer: Medicaid Other | Source: Ambulatory Visit | Attending: Obstetrics and Gynecology | Admitting: Obstetrics and Gynecology

## 2013-04-20 ENCOUNTER — Encounter (HOSPITAL_COMMUNITY): Payer: Self-pay | Admitting: *Deleted

## 2013-04-20 DIAGNOSIS — O4703 False labor before 37 completed weeks of gestation, third trimester: Secondary | ICD-10-CM

## 2013-04-20 DIAGNOSIS — Y92009 Unspecified place in unspecified non-institutional (private) residence as the place of occurrence of the external cause: Secondary | ICD-10-CM | POA: Insufficient documentation

## 2013-04-20 DIAGNOSIS — O4700 False labor before 37 completed weeks of gestation, unspecified trimester: Secondary | ICD-10-CM | POA: Insufficient documentation

## 2013-04-20 DIAGNOSIS — O0933 Supervision of pregnancy with insufficient antenatal care, third trimester: Secondary | ICD-10-CM

## 2013-04-20 DIAGNOSIS — M545 Low back pain, unspecified: Secondary | ICD-10-CM | POA: Insufficient documentation

## 2013-04-20 DIAGNOSIS — R000 Tachycardia, unspecified: Secondary | ICD-10-CM

## 2013-04-20 DIAGNOSIS — M25519 Pain in unspecified shoulder: Secondary | ICD-10-CM | POA: Insufficient documentation

## 2013-04-20 LAB — RAPID URINE DRUG SCREEN, HOSP PERFORMED
Amphetamines: NOT DETECTED
Barbiturates: NOT DETECTED
Opiates: NOT DETECTED
Tetrahydrocannabinol: POSITIVE — AB

## 2013-04-20 LAB — URINE MICROSCOPIC-ADD ON

## 2013-04-20 LAB — URINALYSIS, ROUTINE W REFLEX MICROSCOPIC
Protein, ur: 100 mg/dL — AB
Urobilinogen, UA: 0.2 mg/dL (ref 0.0–1.0)

## 2013-04-20 LAB — WET PREP, GENITAL: Yeast Wet Prep HPF POC: NONE SEEN

## 2013-04-20 MED ORDER — CYCLOBENZAPRINE HCL 10 MG PO TABS: 10.0000 mg | ORAL_TABLET | Freq: Three times a day (TID) | ORAL | Status: DC | PRN

## 2013-04-20 MED ORDER — ONDANSETRON 4 MG PO TBDP
4.0000 mg | ORAL_TABLET | Freq: Once | ORAL | Status: AC
  Administered 2013-04-20: 4 mg via ORAL
  Filled 2013-04-20: qty 1

## 2013-04-20 MED ORDER — NIFEDIPINE 10 MG PO CAPS
10.0000 mg | ORAL_CAPSULE | Freq: Three times a day (TID) | ORAL | Status: DC
  Administered 2013-04-20: 10 mg via ORAL
  Filled 2013-04-20: qty 1

## 2013-04-20 MED ORDER — NIFEDIPINE 10 MG PO CAPS
10.0000 mg | ORAL_CAPSULE | ORAL | Status: AC | PRN
  Administered 2013-04-20 (×2): 10 mg via ORAL
  Filled 2013-04-20 (×2): qty 1

## 2013-04-20 MED ORDER — CYCLOBENZAPRINE HCL 10 MG PO TABS
10.0000 mg | ORAL_TABLET | Freq: Three times a day (TID) | ORAL | Status: DC | PRN
  Administered 2013-04-20: 10 mg via ORAL
  Filled 2013-04-20: qty 1

## 2013-04-20 NOTE — MAU Provider Note (Signed)
Attestation of Attending Supervision of Advanced Practitioner (CNM/NP): Evaluation and management procedures were performed by the Advanced Practitioner under my supervision and collaboration.  I have reviewed the Advanced Practitioner's note and chart, and I agree with the management and plan.  Nyeli Holtmeyer 04/20/2013 7:36 PM
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
## 2013-04-21 LAB — GC/CHLAMYDIA PROBE AMP
CT Probe RNA: NEGATIVE
GC Probe RNA: NEGATIVE

## 2013-05-04 ENCOUNTER — Ambulatory Visit (INDEPENDENT_AMBULATORY_CARE_PROVIDER_SITE_OTHER): Payer: Medicaid Other | Admitting: Obstetrics and Gynecology

## 2013-05-04 ENCOUNTER — Other Ambulatory Visit: Payer: Self-pay | Admitting: Obstetrics and Gynecology

## 2013-05-04 ENCOUNTER — Encounter: Payer: Self-pay | Admitting: Obstetrics and Gynecology

## 2013-05-04 VITALS — BP 126/57 | Wt 170.0 lb

## 2013-05-04 DIAGNOSIS — O0930 Supervision of pregnancy with insufficient antenatal care, unspecified trimester: Secondary | ICD-10-CM

## 2013-05-04 DIAGNOSIS — D509 Iron deficiency anemia, unspecified: Secondary | ICD-10-CM

## 2013-05-04 DIAGNOSIS — O0933 Supervision of pregnancy with insufficient antenatal care, third trimester: Secondary | ICD-10-CM

## 2013-05-04 DIAGNOSIS — Z3403 Encounter for supervision of normal first pregnancy, third trimester: Secondary | ICD-10-CM

## 2013-05-04 LAB — POCT URINALYSIS DIP (DEVICE)
Glucose, UA: NEGATIVE mg/dL
Hgb urine dipstick: NEGATIVE
Nitrite: NEGATIVE
Urobilinogen, UA: 0.2 mg/dL (ref 0.0–1.0)
pH: 8.5 — ABNORMAL HIGH (ref 5.0–8.0)

## 2013-05-04 NOTE — Progress Notes (Signed)
Patient doing well without complaints. FM/PTL precautions reviewed. Cultures next visit

## 2013-05-04 NOTE — Progress Notes (Signed)
Pulse: 86

## 2013-05-13 ENCOUNTER — Encounter: Payer: Medicaid Other | Admitting: Advanced Practice Midwife

## 2013-05-18 ENCOUNTER — Inpatient Hospital Stay (HOSPITAL_COMMUNITY)
Admission: AD | Admit: 2013-05-18 | Discharge: 2013-05-18 | Disposition: A | Discharge status: Home / Self Care | Payer: Medicaid Other | Source: Ambulatory Visit | Attending: Obstetrics & Gynecology | Admitting: Obstetrics & Gynecology

## 2013-05-18 ENCOUNTER — Inpatient Hospital Stay (HOSPITAL_COMMUNITY)
Admission: AD | Admit: 2013-05-18 | Discharge: 2013-05-20 | DRG: 775 | Disposition: A | Discharge status: Home / Self Care | Payer: Medicaid Other | Source: Ambulatory Visit | Attending: Obstetrics & Gynecology | Admitting: Obstetrics & Gynecology

## 2013-05-18 ENCOUNTER — Encounter (HOSPITAL_COMMUNITY): Payer: Self-pay | Admitting: Anesthesiology

## 2013-05-18 ENCOUNTER — Inpatient Hospital Stay (HOSPITAL_COMMUNITY): Payer: Medicaid Other | Admitting: Anesthesiology

## 2013-05-18 ENCOUNTER — Encounter (HOSPITAL_COMMUNITY): Payer: Self-pay | Admitting: *Deleted

## 2013-05-18 DIAGNOSIS — IMO0001 Reserved for inherently not codable concepts without codable children: Secondary | ICD-10-CM

## 2013-05-18 DIAGNOSIS — O0933 Supervision of pregnancy with insufficient antenatal care, third trimester: Secondary | ICD-10-CM

## 2013-05-18 DIAGNOSIS — Z3403 Encounter for supervision of normal first pregnancy, third trimester: Secondary | ICD-10-CM

## 2013-05-18 DIAGNOSIS — N949 Unspecified condition associated with female genital organs and menstrual cycle: Secondary | ICD-10-CM | POA: Insufficient documentation

## 2013-05-18 DIAGNOSIS — O479 False labor, unspecified: Secondary | ICD-10-CM

## 2013-05-18 DIAGNOSIS — O4700 False labor before 37 completed weeks of gestation, unspecified trimester: Secondary | ICD-10-CM | POA: Insufficient documentation

## 2013-05-18 LAB — URINALYSIS, ROUTINE W REFLEX MICROSCOPIC
Bilirubin Urine: NEGATIVE
Glucose, UA: NEGATIVE mg/dL
Hgb urine dipstick: NEGATIVE
Specific Gravity, Urine: 1.015 (ref 1.005–1.030)
pH: 7 (ref 5.0–8.0)

## 2013-05-18 LAB — CBC
MCH: 26.1 pg (ref 26.0–34.0)
Platelets: 204 10*3/uL (ref 150–400)
RBC: 3.79 MIL/uL — ABNORMAL LOW (ref 3.87–5.11)
WBC: 8.5 10*3/uL (ref 4.0–10.5)

## 2013-05-18 LAB — OB RESULTS CONSOLE GBS: GBS: NEGATIVE

## 2013-05-18 MED ORDER — LACTATED RINGERS IV SOLN
500.0000 mL | Freq: Once | INTRAVENOUS | Status: AC
  Administered 2013-05-18: 500 mL via INTRAVENOUS

## 2013-05-18 MED ORDER — NALBUPHINE HCL 10 MG/ML IJ SOLN: 5.0000 mg | INTRAMUSCULAR | Status: DC | PRN

## 2013-05-18 MED ORDER — OXYTOCIN BOLUS FROM INFUSION: 500.0000 mL | INTRAVENOUS | Status: DC

## 2013-05-18 MED ORDER — ONDANSETRON HCL 4 MG/2ML IJ SOLN: 4.0000 mg | Freq: Four times a day (QID) | INTRAMUSCULAR | Status: DC | PRN

## 2013-05-18 MED ORDER — LACTATED RINGERS IV SOLN: 500.0000 mL | INTRAVENOUS | Status: DC | PRN

## 2013-05-18 MED ORDER — FENTANYL 2.5 MCG/ML BUPIVACAINE 1/10 % EPIDURAL INFUSION (WH - ANES)
14.0000 mL/h | INTRAMUSCULAR | Status: DC | PRN
  Administered 2013-05-18: 14 mL/h via EPIDURAL
  Filled 2013-05-18: qty 125

## 2013-05-18 MED ORDER — PHENYLEPHRINE 40 MCG/ML (10ML) SYRINGE FOR IV PUSH (FOR BLOOD PRESSURE SUPPORT)
80.0000 ug | PREFILLED_SYRINGE | INTRAVENOUS | Status: DC | PRN
  Filled 2013-05-18: qty 2

## 2013-05-18 MED ORDER — LACTATED RINGERS IV SOLN
INTRAVENOUS | Status: DC
  Administered 2013-05-18: 20:00:00 via INTRAVENOUS

## 2013-05-18 MED ORDER — PENICILLIN G POTASSIUM 5000000 UNITS IJ SOLR
2.5000 10*6.[IU] | INTRAMUSCULAR | Status: DC
  Filled 2013-05-18 (×4): qty 2.5

## 2013-05-18 MED ORDER — PENICILLIN G POTASSIUM 5000000 UNITS IJ SOLR
5.0000 10*6.[IU] | Freq: Once | INTRAVENOUS | Status: AC
  Administered 2013-05-18: 5 10*6.[IU] via INTRAVENOUS
  Filled 2013-05-18: qty 5

## 2013-05-18 MED ORDER — LIDOCAINE HCL (PF) 1 % IJ SOLN
30.0000 mL | INTRAMUSCULAR | Status: DC | PRN
  Filled 2013-05-18 (×2): qty 30

## 2013-05-18 MED ORDER — NALBUPHINE SYRINGE 5 MG/0.5 ML
5.0000 mg | INJECTION | INTRAMUSCULAR | Status: DC | PRN
  Filled 2013-05-18: qty 0.5

## 2013-05-18 MED ORDER — LIDOCAINE HCL (PF) 1 % IJ SOLN
INTRAMUSCULAR | Status: DC | PRN
  Administered 2013-05-18 (×2): 5 mL

## 2013-05-18 MED ORDER — DIPHENHYDRAMINE HCL 50 MG/ML IJ SOLN: 12.5000 mg | INTRAMUSCULAR | Status: DC | PRN

## 2013-05-18 MED ORDER — ZOLPIDEM TARTRATE 5 MG PO TABS: 5.0000 mg | ORAL_TABLET | Freq: Every evening | ORAL | Status: DC | PRN

## 2013-05-18 MED ORDER — EPHEDRINE 5 MG/ML INJ
10.0000 mg | INTRAVENOUS | Status: DC | PRN
  Filled 2013-05-18: qty 2

## 2013-05-18 MED ORDER — EPHEDRINE 5 MG/ML INJ
10.0000 mg | INTRAVENOUS | Status: DC | PRN
  Filled 2013-05-18: qty 4
  Filled 2013-05-18: qty 2

## 2013-05-18 MED ORDER — PHENYLEPHRINE 40 MCG/ML (10ML) SYRINGE FOR IV PUSH (FOR BLOOD PRESSURE SUPPORT)
80.0000 ug | PREFILLED_SYRINGE | INTRAVENOUS | Status: DC | PRN
  Filled 2013-05-18: qty 2
  Filled 2013-05-18: qty 5

## 2013-05-18 MED ORDER — OXYTOCIN 40 UNITS IN LACTATED RINGERS INFUSION - SIMPLE MED
62.5000 mL/h | INTRAVENOUS | Status: DC
  Administered 2013-05-19 (×2): 62.5 mL/h via INTRAVENOUS
  Filled 2013-05-18: qty 1000

## 2013-05-18 MED ORDER — CITRIC ACID-SODIUM CITRATE 334-500 MG/5ML PO SOLN: 30.0000 mL | ORAL | Status: DC | PRN

## 2013-05-18 MED ORDER — ACETAMINOPHEN 325 MG PO TABS: 650.0000 mg | ORAL_TABLET | ORAL | Status: DC | PRN

## 2013-05-18 MED ORDER — OXYCODONE-ACETAMINOPHEN 5-325 MG PO TABS: 1.0000 | ORAL_TABLET | ORAL | Status: DC | PRN

## 2013-05-18 MED ORDER — IBUPROFEN 600 MG PO TABS: 600.0000 mg | ORAL_TABLET | Freq: Four times a day (QID) | ORAL | Status: DC | PRN

## 2013-05-18 NOTE — MAU Provider Note (Signed)
History     CSN: [id number]  Arrival date and time: 05/18/13 1333   None     Chief Complaint  Patient presents with  . Contractions  . Vaginal Discharge   HPI  [name] is a 23 y.o. female G1P0 at [redacted]w[redacted]d who presents to the MAU today for contractions that started overnight and a whitish-clear thick vaginal discharge that started this morning. She has been getting prenatal care in the [facility] downstairs.  Contractions were timed every 10 minutes, then were every 5 minutes and she rates at 9/10 on the pain scale. She admits to feeling the baby move regularly, tolerating food and drink; she denies vaginal bleeding, any dysuria or dyschezia.    OB History   Grav Para Term Preterm Abortions TAB SAB Ect Mult Living   1               Past Medical History  Diagnosis Date  . Anemia     Past Surgical History  Procedure Laterality Date  . Wisdom tooth extraction  2011    Family History  Problem Relation Age of Onset  . Asthma Sister   . Asthma Brother     History  Substance Use Topics  . Smoking status: Never Smoker   . Smokeless tobacco: Never Used  . Alcohol Use: No     Comment: not since pregnant, but before that on weekends    Allergies: No Known Allergies  Prescriptions prior to admission  Medication Sig Dispense Refill  . acetaminophen (TYLENOL) 500 MG tablet Take 1,000 mg by mouth daily as needed. For pain      . cyclobenzaprine (FLEXERIL) 10 MG tablet Take 1 tablet (10 mg total) by mouth 3 (three) times daily as needed for muscle spasms (and back pain).  30 tablet  0  . ferrous sulfate 325 (65 FE) MG tablet Take 1 tablet (325 mg total) by mouth 3 (three) times daily with meals.  90 tablet  11  . Prenatal Vit-Fe Fumarate-FA (PRENATAL MULTIVITAMIN) TABS Take 2 tablets by mouth daily.        Review of Systems  Constitutional: Negative for fever and chills.  HENT: Negative for ear pain and tinnitus.   Eyes: Negative for blurred vision, double vision and pain.   Respiratory: Positive for cough and sputum production. Negative for hemoptysis, shortness of breath and wheezing.   Cardiovascular: Negative for chest pain, palpitations and claudication.  Gastrointestinal: Positive for heartburn, nausea and vomiting. Negative for abdominal pain, diarrhea, constipation and blood in stool.  Genitourinary: Positive for frequency. Negative for dysuria, urgency and hematuria.  Skin: Negative for rash.  Neurological: Positive for headaches. Negative for weakness.   Physical Exam   Blood pressure 115/70, pulse 68, temperature 97.6 F (36.4 C), temperature source Oral, resp. rate 16, height 5\' 2"  (1.575 m), weight 77.565 kg (171 lb), last menstrual period 09/03/2012.  Physical Exam  Constitutional: She is oriented to person, place, and time. She appears well-developed and well-nourished. No distress.  HENT:  Head: Normocephalic.  Eyes: Pupils are equal, round, and reactive to light.  Cardiovascular: Normal rate and regular rhythm.  Exam reveals no gallop and no friction rub.   No murmur heard. Respiratory: Effort normal and breath sounds normal. She has no wheezes. She has no rales.  GI:  Abdomen gravid.  Normoactive bowel sounds x4, non-tender.    Genitourinary: There is no tenderness, lesion or injury on the right labia. There is no tenderness, lesion or injury on the  left labia. Cervix exhibits no discharge and no friability. Vaginal discharge found.  1.5/50%/-3  Neurological: She is alert and oriented to person, place, and time.    MAU Course  Procedures  Group B strep swab performed in MAU; results pending.   Assessment and Plan  1)We will provide her ambien to take PRN.   2) We will discharge home and instruct patient to stop by the clinic to schedule follow-up appointment.   3) She is instructed to return if she increases in discomfort, contractions get <5 minutes apart, her water breaks, or if she feels the baby stop moving.   Anna Genre 05/18/2013, 2:24 PM   I saw and examined patient and agree with above student note. I reviewed history, imaging, labs, and vitals. I personally reviewed the fetal heart tracing, and it is reactive. Napoleon Form, MD

## 2013-05-18 NOTE — Anesthesia Procedure Notes (Signed)
Epidural Patient location during procedure: OB Start time: 05/18/2013 9:26 PM  Staffing Anesthesiologist: Angus Seller., Harrell Gave. Performed by: anesthesiologist   Preanesthetic Checklist Completed: patient identified, site marked, surgical consent, pre-op evaluation, timeout performed, IV checked, risks and benefits discussed and monitors and equipment checked  Epidural Patient position: sitting Prep: site prepped and draped and DuraPrep Patient monitoring: continuous pulse ox and blood pressure Approach: midline Injection technique: LOR air and LOR saline  Needle:  Needle type: Tuohy  Needle gauge: 17 G Needle length: 9 cm and 9 Needle insertion depth: 5 cm cm Catheter type: closed end flexible Catheter size: 19 Gauge Catheter at skin depth: 10 cm Test dose: negative  Assessment Events: blood not aspirated, injection not painful, no injection resistance, negative IV test and no paresthesia  Additional Notes Patient identified.  Risk benefits discussed including failed block, incomplete pain control, headache, nerve damage, paralysis, blood pressure changes, nausea, vomiting, reactions to medication both toxic or allergic, and postpartum back pain.  Patient expressed understanding and wished to proceed.  All questions were answered.  Sterile technique used throughout procedure and epidural site dressed with sterile barrier dressing. No paresthesia or other complications noted.The patient did not experience any signs of intravascular injection such as tinnitus or metallic taste in mouth nor signs of intrathecal spread such as rapid motor block. Please see nursing notes for vital signs.

## 2013-05-18 NOTE — MAU Provider Note (Signed)
  History     CSN: [id number]  Arrival date and time: 05/18/13 6213   First Provider Initiated Contact with Patient 05/18/13 1839    Chief Complaint  Patient presents with  . Rupture of Membranes  . Contractions   HPI [name] is a 23 y.o. G1P0 at [redacted]w[redacted]d who presents with ROM.  Patient reports that at 5:30 PM she was lying down when she noted a gush of fluid.  Fluid was accompanied by regular frequent contractions.  No vaginal bleeding/bloody show.  No other complaints at this time.  ROS: Denies chest pain, SOB, vision changes, vaginal bleeding, fever, chills.   Past Medical History  Diagnosis Date  . Anemia     Past Surgical History  Procedure Laterality Date  . Wisdom tooth extraction  2011    Family History  Problem Relation Age of Onset  . Asthma Sister   . Asthma Brother     History  Substance Use Topics  . Smoking status: Never Smoker   . Smokeless tobacco: Never Used  . Alcohol Use: No     Comment: not since pregnant, but before that on weekends    Allergies: No Known Allergies  Prescriptions prior to admission  Medication Sig Dispense Refill  . acetaminophen (TYLENOL) 500 MG tablet Take 1,000 mg by mouth daily as needed. For pain      . cyclobenzaprine (FLEXERIL) 10 MG tablet Take 1 tablet (10 mg total) by mouth 3 (three) times daily as needed for muscle spasms (and back pain).  30 tablet  0  . ferrous sulfate 325 (65 FE) MG tablet Take 1 tablet (325 mg total) by mouth 3 (three) times daily with meals.  90 tablet  11  . Prenatal Vit-Fe Fumarate-FA (PRENATAL MULTIVITAMIN) TABS Take 2 tablets by mouth daily.      Marland Kitchen zolpidem (AMBIEN) 5 MG tablet Take 1 tablet (5 mg total) by mouth at bedtime as needed for sleep.  10 tablet  0    ROS See HPI  Physical Exam   Blood pressure 124/70, pulse 82, temperature 97.3 F (36.3 C), temperature source Oral, resp. rate 20, last menstrual period 09/03/2012.  Physical Exam Gen: well appearing, NAD. Heart: RRR.  No murmurs/rubs/gallops. Lungs: CTAB. No rales, rhonchi, or wheezing. Abd: gravid but otherwise soft, nontender to palpation Ext: no appreciable lower extremity edema bilaterally Neuro: no focal deficits. Pelvic Exam:        External: normal female genitalia without lesions or masses        Vagina: pooling of fluid noted.        Cervix: See below Dilation: 3.5 Effacement (%): 80 Cervical Position: Middle Station: -2 Presentation: Vertex Exam by:: Dr Cook/ Peace, rn  FHR: baseline 155, Moderate Variability, 15 x 15 accels, No decels Toco: 2-10  MAU Course  Procedures  Assessment and Plan  Janet Mas is a 23 y.o. G1P0 at [redacted]w[redacted]d who presents with ROM.   - Fern negative, but fluid and pooling on exam. - Category 1 FHT, Toco 2-10 mins - Will admit to birthing suite, anticipate NSVD  Everlene Other 05/18/2013, 7:00 PM   I saw and examined patient and agree with above resident note. I reviewed history, imaging, labs, and vitals. I personally reviewed the fetal heart tracing, and it is reactive. Napoleon Form, MD

## 2013-05-18 NOTE — MAU Note (Signed)
Patient is in with c/o ROM about 1730pm with clear fluids and ctx q75m. She reports good fetal movement.

## 2013-05-18 NOTE — Anesthesia Preprocedure Evaluation (Signed)

## 2013-05-18 NOTE — MAU Note (Signed)
Pt states here for contractions and whitish clear thick vaginal discharge. Ctx's were q10, then were q5 minutes. No bleeding.

## 2013-05-18 NOTE — H&P (Signed)
[name] is a 23 y.o. G1P0 at [redacted]w[redacted]d who presents with ROM which occurred at ~5:30 PM.  Following ROM, patient began having regular, frequent contractions.  This prompted her to present to the MAU for further evaluation.  In the MAU, patient was noted to have pooling of fluid on exam and contractions every 2-10 mins.  Maternal Medical History:  Reason for admission: Rupture of membranes and contractions.     OB History   Grav Para Term Preterm Abortions TAB SAB Ect Mult Living   1              Past Medical History  Diagnosis Date  . Anemia    Past Surgical History  Procedure Laterality Date  . Wisdom tooth extraction  2011   Family History: family history includes Asthma in her brother and sister. Social History:  reports that she has never smoked. She has never used smokeless tobacco. She reports that she does not drink alcohol or use illicit drugs.   Prenatal Transfer Tool  Maternal Diabetes: No Genetic Screening: No, Too late Maternal Ultrasounds/Referrals: Normal Fetal Ultrasounds or other Referrals:  None Maternal Substance Abuse:  No Significant Maternal Medications:  None Significant Maternal Lab Results:  None  Review of Systems  Constitutional: Negative for fever and chills.  Eyes: Negative for blurred vision.  Respiratory: Negative for shortness of breath.   Cardiovascular: Negative for chest pain and leg swelling.  Gastrointestinal: Negative for vomiting and abdominal pain.  Genitourinary: Negative for dysuria.  Neurological: Negative for headaches.    Dilation: 3.5 Effacement (%): 80 Station: -2 Exam by:: Dr Cook/ Peace, rn Blood pressure 124/70, pulse 82, temperature 97.3 F (36.3 C), temperature source Oral, resp. rate 20, last menstrual period 09/03/2012. Exam Physical Exam  Gen: well appearing, anxious, NAD. Heart: RRR. No murmurs, rubs, or gallops. Lungs: CTAB, no rales, rhonchi, or wheezing. Abd: gravid but otherwise soft, nontender to  palpation Ext: no appreciable lower extremity edema bilaterally Neuro: no focal deficits. GU: normal appearing external genitalia. Active flow of clear fluid noted. Cervix: Dilation: 3.5 Effacement (%): 80 Cervical Position: Middle Station: -2 Presentation: Vertex Exam by:: Dr [employer] Peace, rn  Prenatal labs: ABO, Rh: O/POS/-- (03/03 1148) Antibody: NEG (03/03 1148) Rubella: 0.85 (03/03 1148) RPR: NON REAC (03/03 1148)  HBsAg: NEGATIVE (03/03 1148)  HIV: NON REACTIVE (03/03 1148)  GBS:     Assessment/Plan: Janet Brasil is a 23 y.o. G1P0 at [redacted]w[redacted]d who presents following ROM.  She is now having regular contractions.  Plan: - Admit to Birthing Suite - Pain management: Nubain, ?Epidural - GBS pending - Anticipate NSVD  Everlene Other 05/18/2013, 7:21 PM   I saw and examined patient and agree with above resident note. I reviewed history, imaging, labs, and vitals. I personally reviewed the fetal heart tracing, and it is reactive. Napoleon Form, MD

## 2013-05-19 ENCOUNTER — Encounter (HOSPITAL_COMMUNITY): Payer: Self-pay | Admitting: *Deleted

## 2013-05-19 ENCOUNTER — Encounter: Payer: Self-pay | Admitting: Advanced Practice Midwife

## 2013-05-19 LAB — RPR: RPR Ser Ql: NONREACTIVE

## 2013-05-19 MED ORDER — OXYCODONE-ACETAMINOPHEN 5-325 MG PO TABS: 1.0000 | ORAL_TABLET | ORAL | Status: DC | PRN

## 2013-05-19 MED ORDER — SIMETHICONE 80 MG PO CHEW: 80.0000 mg | CHEWABLE_TABLET | ORAL | Status: DC | PRN

## 2013-05-19 MED ORDER — IBUPROFEN 600 MG PO TABS
600.0000 mg | ORAL_TABLET | Freq: Four times a day (QID) | ORAL | Status: DC
  Administered 2013-05-19 – 2013-05-20 (×6): 600 mg via ORAL
  Filled 2013-05-19 (×6): qty 1

## 2013-05-19 MED ORDER — TETANUS-DIPHTH-ACELL PERTUSSIS 5-2.5-18.5 LF-MCG/0.5 IM SUSP: 0.5000 mL | Freq: Once | INTRAMUSCULAR | Status: DC

## 2013-05-19 MED ORDER — BISACODYL 10 MG RE SUPP: 10.0000 mg | Freq: Every day | RECTAL | Status: DC | PRN

## 2013-05-19 MED ORDER — FLEET ENEMA 7-19 GM/118ML RE ENEM: 1.0000 | ENEMA | Freq: Every day | RECTAL | Status: DC | PRN

## 2013-05-19 MED ORDER — MISOPROSTOL 200 MCG PO TABS
ORAL_TABLET | ORAL | Status: AC
  Filled 2013-05-19: qty 4

## 2013-05-19 MED ORDER — ONDANSETRON HCL 4 MG/2ML IJ SOLN: 4.0000 mg | INTRAMUSCULAR | Status: DC | PRN

## 2013-05-19 MED ORDER — FERROUS SULFATE 325 (65 FE) MG PO TABS
325.0000 mg | ORAL_TABLET | Freq: Two times a day (BID) | ORAL | Status: DC
  Administered 2013-05-19 – 2013-05-20 (×3): 325 mg via ORAL
  Filled 2013-05-19 (×3): qty 1

## 2013-05-19 MED ORDER — MEASLES, MUMPS & RUBELLA VAC ~~LOC~~ INJ
0.5000 mL | INJECTION | Freq: Once | SUBCUTANEOUS | Status: DC
  Filled 2013-05-19 (×2): qty 0.5

## 2013-05-19 MED ORDER — PRENATAL MULTIVITAMIN CH
1.0000 | ORAL_TABLET | Freq: Every day | ORAL | Status: DC
  Administered 2013-05-19 – 2013-05-20 (×2): 1 via ORAL
  Filled 2013-05-19 (×2): qty 1

## 2013-05-19 MED ORDER — DIPHENHYDRAMINE HCL 25 MG PO CAPS: 25.0000 mg | ORAL_CAPSULE | Freq: Four times a day (QID) | ORAL | Status: DC | PRN

## 2013-05-19 MED ORDER — ZOLPIDEM TARTRATE 5 MG PO TABS: 5.0000 mg | ORAL_TABLET | Freq: Every evening | ORAL | Status: DC | PRN

## 2013-05-19 MED ORDER — OXYTOCIN 40 UNITS IN LACTATED RINGERS INFUSION - SIMPLE MED: 62.5000 mL/h | INTRAVENOUS | Status: DC | PRN

## 2013-05-19 MED ORDER — WITCH HAZEL-GLYCERIN EX PADS: 1.0000 "application " | MEDICATED_PAD | CUTANEOUS | Status: DC | PRN

## 2013-05-19 MED ORDER — LANOLIN HYDROUS EX OINT: TOPICAL_OINTMENT | CUTANEOUS | Status: DC | PRN

## 2013-05-19 MED ORDER — SENNOSIDES-DOCUSATE SODIUM 8.6-50 MG PO TABS
2.0000 | ORAL_TABLET | Freq: Every day | ORAL | Status: DC
  Administered 2013-05-19: 2 via ORAL

## 2013-05-19 MED ORDER — BENZOCAINE-MENTHOL 20-0.5 % EX AERO: 1.0000 "application " | INHALATION_SPRAY | CUTANEOUS | Status: DC | PRN

## 2013-05-19 MED ORDER — ONDANSETRON HCL 4 MG PO TABS: 4.0000 mg | ORAL_TABLET | ORAL | Status: DC | PRN

## 2013-05-19 MED ORDER — DIBUCAINE 1 % RE OINT: 1.0000 "application " | TOPICAL_OINTMENT | RECTAL | Status: DC | PRN

## 2013-05-19 NOTE — Anesthesia Postprocedure Evaluation (Signed)
  Anesthesia Post-op Note  Patient: [name]  Procedure(s) Performed: * No procedures listed *  Patient Location: Mother/Baby  Anesthesia Type:Epidural  Level of Consciousness: awake, alert  and oriented  Airway and Oxygen Therapy: Patient Spontanous Breathing  Post-op Pain: none  Post-op Assessment: Post-op Vital signs reviewed, Pain level controlled, No headache, No backache, No residual numbness and No residual motor weakness  Post-op Vital Signs: Reviewed and stable  Complications: No apparent anesthesia complications

## 2013-05-20 ENCOUNTER — Encounter: Payer: Medicaid Other | Admitting: Advanced Practice Midwife

## 2013-05-20 MED ORDER — IBUPROFEN 600 MG PO TABS: 600.0000 mg | ORAL_TABLET | Freq: Four times a day (QID) | ORAL | Status: DC

## 2013-05-20 NOTE — Progress Notes (Signed)
UR chart review completed.  

## 2013-05-20 NOTE — Progress Notes (Signed)
CSW provided pt with 2 meal vouchers since she is being discharged & will no longer be able to order meals.  Pt does not have the money to purchase meals, as per RN.  CSW available as a resources until discharge.

## 2013-05-20 NOTE — Progress Notes (Signed)
Pt requested to sleep and for PKU not to be drawn on infant at this time.

## 2013-05-20 NOTE — Discharge Summary (Signed)
Obstetric Discharge Summary Reason for Admission: onset of labor and rupture of membranes Prenatal Procedures: none Intrapartum Procedures: spontaneous vaginal delivery Postpartum Procedures: none Complications-Operative and Postpartum: none Hemoglobin  Date Value Range Status  05/18/2013 9.9* 12.0 - 15.0 g/dL Final     HCT  Date Value Range Status  05/18/2013 30.1* 36.0 - 46.0 % Final    Physical Exam:  General: alert, cooperative and no distress Lochia: appropriate Uterine Fundus: firm DVT Evaluation: No evidence of DVT seen on physical exam.  Discharge Diagnoses: Term Pregnancy-delivered  Discharge Information: Date: 05/20/2013 Activity: pelvic rest Diet: routine Medications: PNV, Ibuprofen and Iron Condition: stable Instructions: refer to practice specific booklet Discharge to: home Follow-up Information   Follow up with [facility]. (Make a postpartum appointment for 4-6 weeks)    Contact information:   [address] [place] [address] [phone]      Newborn Data: Live born female  Birth Weight: 5 lb 11 oz (2580 g) APGAR: 8, 9  Home with mother. Breastfeeding; undecided re contraception.  Cam Hai 05/20/2013, 7:22 AM

## 2013-05-21 LAB — CULTURE, BETA STREP (GROUP B ONLY)

## 2013-05-21 MED ORDER — OXYCODONE-ACETAMINOPHEN 5-325 MG PO TABS: 1.0000 | ORAL_TABLET | ORAL | Status: DC | PRN

## 2013-05-26 NOTE — MAU Provider Note (Signed)
Attestation of Attending Supervision of Advanced Practitioner (CNM/NP): Evaluation and management procedures were performed by the Advanced Practitioner under my supervision and collaboration. I have reviewed the Advanced Practitioner's note and chart, and I agree with the management and plan.  Kalena Mander H. 2:47 PM   

## 2013-05-26 NOTE — H&P (Signed)
Agree with above note.  [name] H. 05/26/2013 1:50 PM

## 2013-05-26 NOTE — MAU Provider Note (Signed)
Attestation of Attending Supervision of Advanced Practitioner (CNM/NP): Evaluation and management procedures were performed by the Advanced Practitioner under my supervision and collaboration. I have reviewed the Advanced Practitioner's note and chart, and I agree with the management and plan.  Elishah Ashmore H. 2:48 PM   

## 2013-06-24 ENCOUNTER — Ambulatory Visit: Payer: Self-pay | Admitting: Obstetrics & Gynecology

## 2013-06-24 ENCOUNTER — Ambulatory Visit: Payer: Medicaid Other | Admitting: Advanced Practice Midwife

## 2013-09-02 ENCOUNTER — Encounter: Payer: Self-pay | Admitting: *Deleted

## 2014-11-01 ENCOUNTER — Encounter (HOSPITAL_COMMUNITY): Payer: Self-pay | Admitting: *Deleted

## 2015-02-01 ENCOUNTER — Encounter (HOSPITAL_COMMUNITY): Payer: Self-pay | Admitting: Emergency Medicine

## 2015-02-01 ENCOUNTER — Emergency Department (HOSPITAL_COMMUNITY)
Admission: EM | Admit: 2015-02-01 | Discharge: 2015-02-02 | Discharge status: Against Medical Advice | Payer: Medicaid Other | Attending: Emergency Medicine | Admitting: Emergency Medicine

## 2015-02-01 DIAGNOSIS — Z79899 Other long term (current) drug therapy: Secondary | ICD-10-CM | POA: Insufficient documentation

## 2015-02-01 DIAGNOSIS — B349 Viral infection, unspecified: Secondary | ICD-10-CM

## 2015-02-01 DIAGNOSIS — D649 Anemia, unspecified: Secondary | ICD-10-CM | POA: Insufficient documentation

## 2015-02-01 DIAGNOSIS — O98911 Unspecified maternal infectious and parasitic disease complicating pregnancy, first trimester: Secondary | ICD-10-CM | POA: Diagnosis not present

## 2015-02-01 DIAGNOSIS — O99011 Anemia complicating pregnancy, first trimester: Secondary | ICD-10-CM | POA: Diagnosis not present

## 2015-02-01 DIAGNOSIS — Z3A00 Weeks of gestation of pregnancy not specified: Secondary | ICD-10-CM | POA: Diagnosis not present

## 2015-02-01 LAB — URINALYSIS, ROUTINE W REFLEX MICROSCOPIC
BILIRUBIN URINE: NEGATIVE
GLUCOSE, UA: NEGATIVE mg/dL
HGB URINE DIPSTICK: NEGATIVE
Ketones, ur: 15 mg/dL — AB
LEUKOCYTES UA: NEGATIVE
Nitrite: NEGATIVE
Protein, ur: NEGATIVE mg/dL
SPECIFIC GRAVITY, URINE: 1.035 — AB (ref 1.005–1.030)
UROBILINOGEN UA: 1 mg/dL (ref 0.0–1.0)
pH: 6.5 (ref 5.0–8.0)

## 2015-02-01 LAB — COMPREHENSIVE METABOLIC PANEL
ALBUMIN: 3.5 g/dL (ref 3.5–5.2)
ALT: 14 U/L (ref 0–35)
AST: 20 U/L (ref 0–37)
Alkaline Phosphatase: 42 U/L (ref 39–117)
Anion gap: 7 (ref 5–15)
BILIRUBIN TOTAL: 0.5 mg/dL (ref 0.3–1.2)
BUN: 9 mg/dL (ref 6–23)
CO2: 23 mmol/L (ref 19–32)
Calcium: 8 mg/dL — ABNORMAL LOW (ref 8.4–10.5)
Chloride: 102 mmol/L (ref 96–112)
Creatinine, Ser: 0.65 mg/dL (ref 0.50–1.10)
GFR calc non Af Amer: >90 mL/min (ref >90)
Glucose, Bld: 89 mg/dL (ref 70–99)
Potassium: 3.4 mmol/L — ABNORMAL LOW (ref 3.5–5.1)
SODIUM: 132 mmol/L — AB (ref 135–145)
Total Protein: 6.3 g/dL (ref 6.0–8.3)

## 2015-02-01 LAB — CBC WITH DIFFERENTIAL/PLATELET
Basophils Absolute: 0 10*3/uL (ref 0.0–0.1)
Basophils Relative: 0 % (ref 0–1)
Eosinophils Absolute: 0 10*3/uL (ref 0.0–0.7)
Eosinophils Relative: 0 % (ref 0–5)
HEMATOCRIT: 36.3 % (ref 36.0–46.0)
Hemoglobin: 12.2 g/dL (ref 12.0–15.0)
Lymphocytes Relative: 15 % (ref 12–46)
Lymphs Abs: 0.7 10*3/uL (ref 0.7–4.0)
MCH: 26.3 pg (ref 26.0–34.0)
MCHC: 33.6 g/dL (ref 30.0–36.0)
MCV: 78.4 fL (ref 78.0–100.0)
Monocytes Absolute: 0.3 10*3/uL (ref 0.1–1.0)
Monocytes Relative: 7 % (ref 3–12)
NEUTROS ABS: 3.5 10*3/uL (ref 1.7–7.7)
Neutrophils Relative %: 78 % — ABNORMAL HIGH (ref 43–77)
PLATELETS: 215 10*3/uL (ref 150–400)
RBC: 4.63 MIL/uL (ref 3.87–5.11)
RDW: 13.3 % (ref 11.5–15.5)
WBC: 4.5 10*3/uL (ref 4.0–10.5)

## 2015-02-01 LAB — POC URINE PREG, ED: PREG TEST UR: POSITIVE — AB

## 2015-02-01 LAB — HCG, QUANTITATIVE, PREGNANCY: HCG, BETA CHAIN, QUANT, S: 72061 m[IU]/mL — AB (ref <5)

## 2015-02-01 MED ORDER — SODIUM CHLORIDE 0.9 % IV BOLUS (SEPSIS)
1000.0000 mL | Freq: Once | INTRAVENOUS | Status: AC
  Administered 2015-02-01: 1000 mL via INTRAVENOUS

## 2015-02-01 MED ORDER — METOCLOPRAMIDE HCL 5 MG/ML IJ SOLN
10.0000 mg | INTRAMUSCULAR | Status: AC
  Administered 2015-02-01: 10 mg via INTRAVENOUS
  Filled 2015-02-01: qty 2

## 2015-02-01 NOTE — ED Notes (Signed)
Went to room to check on pt, pt removed IV and monitor, pt left ER without being discharged.

## 2015-02-01 NOTE — ED Provider Notes (Signed)
CSN: [id number]     Arrival date & time 02/01/15  2103 History   First MD Initiated Contact with Patient 02/01/15 2213     Chief Complaint  Patient presents with  . Generalized Body Aches  . Diarrhea  . Nausea    (Consider location/radiation/quality/duration/timing/severity/associated sxs/prior Treatment) HPI Comments: Patient is a 25 year old female with a history of anemia who presents to the emergency department for further evaluation of generalized body aches with associated nausea and diarrhea 1 this morning. Onset of symptoms were at 0300. Patient states that she had chills intermittently throughout the day. She also reports subjective fever characterized by the sensation of feeling hot. She took ibuprofen for symptoms this morning which provided some relief. She states that she began to feel worse when the ibuprofen wore off. Patient states that she took her daughter to the emergency department yesterday for nausea and vomiting. She denies any other sick contacts. She denies associated chest pain, shortness of breath, dysuria, hematuria, vaginal bleeding, vaginal discharge, and syncope. No hx of abdominal surgeries.  Patient states that she took a pregnancy test last week which was positive. Her last menstrual period was on 12/11/2014.  Patient is a 25 y.o. female presenting with diarrhea. The history is provided by the patient. No language interpreter was used.  Diarrhea Associated symptoms: abdominal pain, chills and fever (subjective)   Associated symptoms: no vomiting     Past Medical History  Diagnosis Date  . Anemia    Past Surgical History  Procedure Laterality Date  . Wisdom tooth extraction  2011   Family History  Problem Relation Age of Onset  . Asthma Sister   . Asthma Brother    History  Substance Use Topics  . Smoking status: Never Smoker   . Smokeless tobacco: Never Used  . Alcohol Use: No     Comment: not since pregnant, but before that on weekends   OB  History    Gravida Para Term Preterm AB TAB SAB Ectopic Multiple Living   1 1  1      1       Review of Systems  Constitutional: Positive for fever (subjective), chills, appetite change and fatigue.  Respiratory: Negative for shortness of breath.   Cardiovascular: Negative for chest pain.  Gastrointestinal: Positive for nausea, abdominal pain and diarrhea (x1). Negative for vomiting and blood in stool.  Genitourinary: Negative for dysuria, hematuria, vaginal bleeding and vaginal discharge.  Neurological: Negative for syncope.  All other systems reviewed and are negative.   Allergies  Review of patient's allergies indicates no known allergies.  Home Medications   Prior to Admission medications   Medication Sig Start Date End Date Taking? Authorizing Provider  acetaminophen (TYLENOL) 500 MG tablet Take 1,000 mg by mouth daily as needed. For pain    Historical Provider, MD  ferrous sulfate 325 (65 FE) MG tablet Take 1 tablet (325 mg total) by mouth 3 (three) times daily with meals. 03/06/13   Archie PattenNatalie K Frazier, CNM  ibuprofen (ADVIL,MOTRIN) 600 MG tablet Take 1 tablet (600 mg total) by mouth every 6 (six) hours. 05/20/13   Arabella MerlesKimberly D Shaw, CNM  oxyCODONE-acetaminophen (ROXICET) 5-325 MG per tablet Take 1 tablet by mouth every 4 (four) hours as needed for pain. 05/21/13   Arabella MerlesKimberly D Shaw, CNM  Prenatal Vit-Fe Fumarate-FA (PRENATAL MULTIVITAMIN) TABS Take 2 tablets by mouth daily.    Historical Provider, MD   BP 114/68 mmHg  Pulse 93  Temp(Src) 98.2 F (36.8 C) (Oral)  Resp 14  Ht  (1.575 m)  SpO2 100%  LMP 12/13/2014   Physical Exam  Constitutional: She is oriented to person, place, and time. She appears well-developed and well-nourished. No distress.  Nontoxic/nonseptic appearing  HENT:  Head: Normocephalic and atraumatic.  Eyes: Conjunctivae and EOM are normal. No scleral icterus.  Neck: Normal range of motion.  Cardiovascular: Normal rate, regular rhythm, normal heart  sounds and intact distal pulses.   Pulmonary/Chest: Effort normal and breath sounds normal. No respiratory distress. She has no wheezes. She has no rales.  Lungs CTAB. Respirations even and unlabored.  Abdominal: Soft. She exhibits no distension and no mass. There is tenderness. There is no rebound and no guarding.  Mild TTP in the LUQ without guarding or peritoneal signs.  Musculoskeletal: Normal range of motion.  Neurological: She is alert and oriented to person, place, and time. She exhibits normal muscle tone. Coordination normal.  GCS 15. Patient moves extremities without ataxia.  Skin: Skin is warm and dry. No rash noted. She is not diaphoretic. No erythema. No pallor.  Psychiatric: She has a normal mood and affect. Her behavior is normal.  Nursing note and vitals reviewed.   ED Course  Procedures (including critical care time) Labs Review Labs Reviewed  URINALYSIS, ROUTINE W REFLEX MICROSCOPIC - Abnormal; Notable for the following:    Specific Gravity, Urine 1.035 (*)    Ketones, ur 15 (*)    All other components within normal limits  CBC WITH DIFFERENTIAL/PLATELET - Abnormal; Notable for the following:    Neutrophils Relative % 78 (*)    All other components within normal limits  COMPREHENSIVE METABOLIC PANEL - Abnormal; Notable for the following:    Sodium 132 (*)    Potassium 3.4 (*)    Calcium 8.0 (*)    All other components within normal limits  HCG, QUANTITATIVE, PREGNANCY - Abnormal; Notable for the following:    hCG, Beta Chain, Quant, S 09811 (*)    All other components within normal limits  POC URINE PREG, ED - Abnormal; Notable for the following:    Preg Test, Ur POSITIVE (*)    All other components within normal limits    Imaging Review No results found.   EKG Interpretation None      EMERGENCY DEPARTMENT [place] PREGNANCY "Study: Limited Ultrasound of the Pelvis"  INDICATIONS:Pregnancy(required) Multiple views of the uterus and pelvic cavity are  obtained with a multi-frequency probe.  APPROACH:Transabdominal   PERFORMED BY: Myself AUTHORIZED BY: Gerhard Munch, MD  IMAGES ARCHIVED?: Yes  LIMITATIONS: Emergent procedure  PREGNANCY FREE FLUID: None  PREGNANCY UTERUS FINDINGS:Gestational sac noted ADNEXAL FINDINGS:Left ovary not seen and Right ovary not seen  PREGNANCY FINDINGS: Intrauterine gestational sac noted  INTERPRETATION: Viable intrauterine pregnancy  GESTATIONAL AGE, ESTIMATE: Early  FETAL HEART RATE: not determined  COMMENT(Estimate of Gestational Age):  Early intrauterine pregnancy; age undetermined   MDM   Final diagnoses:  Viral illness    8268 - 25 year old female presents to the emergency department for nausea, myalgias, chills, and diarrhea 1. Onset of symptoms at 0300 today. Physical exam significant for focal tenderness in the left upper quadrant. No tenderness at or inferior to the umbilicus. No complaints of vaginal bleeding or discharge. Urine pregnancy positive. Bedside ultrasound completed which shows intrauterine gestational sac. Do not suspect ectopic pregnancy. Doubt that symptoms today are secondary to complications of pregnancy. Symptoms most consistent with viral process. Patient states daughter was sick with N/V yesterday. Laboratory workup is stable. Plan  to hydrate and reassess.  2350 - Went to check on patient. Room empty with IV on bed. No belongings in room. Suspect elopement of patient. Unable to recheck abdominal exam secondary to patient elopement.   Filed Vitals:   02/01/15 2111  BP: 114/68  Pulse: 93  Temp: 98.2 F (36.8 C)  TempSrc: Oral  Resp: 14  Height:  (1.575 m)  SpO2: 100%     Antony Madura, PA-C 02/01/15 2355  Gerhard Munch, MD 02/03/15 [phone]

## 2015-02-01 NOTE — ED Notes (Signed)
Pt. reports generalized body aches , nausea and diarrhea x1 this morning . Denies fever , pt. suspects that she is pregnant , requesting preg. test.

## 2015-07-05 LAB — OB RESULTS CONSOLE GC/CHLAMYDIA
CHLAMYDIA, DNA PROBE: NEGATIVE
Gonorrhea: NEGATIVE

## 2015-07-05 LAB — OB RESULTS CONSOLE RUBELLA ANTIBODY, IGM: RUBELLA: NON-IMMUNE/NOT IMMUNE

## 2015-07-05 LAB — OB RESULTS CONSOLE ANTIBODY SCREEN: ANTIBODY SCREEN: NEGATIVE

## 2015-07-05 LAB — OB RESULTS CONSOLE ABO/RH: RH Type: POSITIVE

## 2015-07-05 LAB — OB RESULTS CONSOLE HIV ANTIBODY (ROUTINE TESTING): HIV: NONREACTIVE

## 2015-07-05 LAB — OB RESULTS CONSOLE RPR: RPR: NONREACTIVE

## 2015-09-11 ENCOUNTER — Inpatient Hospital Stay (HOSPITAL_COMMUNITY): Payer: Medicaid Other | Admitting: Anesthesiology

## 2015-09-11 ENCOUNTER — Inpatient Hospital Stay (HOSPITAL_COMMUNITY)
Admission: AD | Admit: 2015-09-11 | Discharge: 2015-09-13 | DRG: 775 | Disposition: A | Discharge status: Home / Self Care | Payer: Medicaid Other | Source: Ambulatory Visit | Attending: Obstetrics and Gynecology | Admitting: Obstetrics and Gynecology

## 2015-09-11 ENCOUNTER — Encounter (HOSPITAL_COMMUNITY): Payer: Self-pay | Admitting: *Deleted

## 2015-09-11 DIAGNOSIS — F329 Major depressive disorder, single episode, unspecified: Secondary | ICD-10-CM | POA: Diagnosis present

## 2015-09-11 DIAGNOSIS — D509 Iron deficiency anemia, unspecified: Secondary | ICD-10-CM

## 2015-09-11 DIAGNOSIS — O99344 Other mental disorders complicating childbirth: Secondary | ICD-10-CM | POA: Diagnosis present

## 2015-09-11 DIAGNOSIS — Z3A38 38 weeks gestation of pregnancy: Secondary | ICD-10-CM | POA: Diagnosis present

## 2015-09-11 HISTORY — DX: Depression, unspecified: F32.A

## 2015-09-11 HISTORY — DX: Major depressive disorder, single episode, unspecified: F32.9

## 2015-09-11 LAB — CBC
HCT: 29.1 % — ABNORMAL LOW (ref 36.0–46.0)
Hemoglobin: 9.6 g/dL — ABNORMAL LOW (ref 12.0–15.0)
MCH: 27.3 pg (ref 26.0–34.0)
MCHC: 33 g/dL (ref 30.0–36.0)
MCV: 82.7 fL (ref 78.0–100.0)
PLATELETS: 157 10*3/uL (ref 150–400)
RBC: 3.52 MIL/uL — ABNORMAL LOW (ref 3.87–5.11)
RDW: 14.4 % (ref 11.5–15.5)
WBC: 10.6 10*3/uL — ABNORMAL HIGH (ref 4.0–10.5)

## 2015-09-11 LAB — TYPE AND SCREEN
ABO/RH(D): O POS
ANTIBODY SCREEN: NEGATIVE

## 2015-09-11 LAB — OB RESULTS CONSOLE GBS: STREP GROUP B AG: NEGATIVE

## 2015-09-11 LAB — ABO/RH: ABO/RH(D): O POS

## 2015-09-11 MED ORDER — LIDOCAINE HCL (PF) 1 % IJ SOLN
30.0000 mL | INTRAMUSCULAR | Status: DC | PRN
  Filled 2015-09-11 (×2): qty 30

## 2015-09-11 MED ORDER — FENTANYL 2.5 MCG/ML BUPIVACAINE 1/10 % EPIDURAL INFUSION (WH - ANES): 14.0000 mL/h | INTRAMUSCULAR | Status: DC | PRN

## 2015-09-11 MED ORDER — DIPHENHYDRAMINE HCL 50 MG/ML IJ SOLN: 12.5000 mg | INTRAMUSCULAR | Status: DC | PRN

## 2015-09-11 MED ORDER — OXYTOCIN BOLUS FROM INFUSION: 500.0000 mL | INTRAVENOUS | Status: DC

## 2015-09-11 MED ORDER — ONDANSETRON HCL 4 MG/2ML IJ SOLN: 4.0000 mg | Freq: Four times a day (QID) | INTRAMUSCULAR | Status: DC | PRN

## 2015-09-11 MED ORDER — ACETAMINOPHEN 325 MG PO TABS: 650.0000 mg | ORAL_TABLET | ORAL | Status: DC | PRN

## 2015-09-11 MED ORDER — LACTATED RINGERS IV SOLN
500.0000 mL | INTRAVENOUS | Status: DC | PRN
  Administered 2015-09-11: 500 mL via INTRAVENOUS

## 2015-09-11 MED ORDER — CITRIC ACID-SODIUM CITRATE 334-500 MG/5ML PO SOLN: 30.0000 mL | ORAL | Status: DC | PRN

## 2015-09-11 MED ORDER — OXYTOCIN 40 UNITS IN LACTATED RINGERS INFUSION - SIMPLE MED
62.5000 mL/h | INTRAVENOUS | Status: DC
  Administered 2015-09-11: 999 mL/h via INTRAVENOUS
  Filled 2015-09-11: qty 1000

## 2015-09-11 MED ORDER — OXYCODONE-ACETAMINOPHEN 5-325 MG PO TABS: 2.0000 | ORAL_TABLET | ORAL | Status: DC | PRN

## 2015-09-11 MED ORDER — IBUPROFEN 600 MG PO TABS
600.0000 mg | ORAL_TABLET | Freq: Four times a day (QID) | ORAL | Status: DC
  Administered 2015-09-11 – 2015-09-13 (×7): 600 mg via ORAL
  Filled 2015-09-11 (×7): qty 1

## 2015-09-11 MED ORDER — FENTANYL 2.5 MCG/ML BUPIVACAINE 1/10 % EPIDURAL INFUSION (WH - ANES)
14.0000 mL/h | INTRAMUSCULAR | Status: DC | PRN
  Administered 2015-09-11: 14 mL/h via EPIDURAL
  Filled 2015-09-11: qty 125

## 2015-09-11 MED ORDER — OXYCODONE-ACETAMINOPHEN 5-325 MG PO TABS: 1.0000 | ORAL_TABLET | ORAL | Status: DC | PRN

## 2015-09-11 MED ORDER — PHENYLEPHRINE 40 MCG/ML (10ML) SYRINGE FOR IV PUSH (FOR BLOOD PRESSURE SUPPORT)
80.0000 ug | PREFILLED_SYRINGE | INTRAVENOUS | Status: DC | PRN
Start: 2015-09-11 — End: 2015-09-12
  Filled 2015-09-11: qty 20
  Filled 2015-09-11: qty 2

## 2015-09-11 MED ORDER — EPHEDRINE 5 MG/ML INJ
10.0000 mg | INTRAVENOUS | Status: DC | PRN
Start: 2015-09-11 — End: 2015-09-12
  Filled 2015-09-11: qty 2

## 2015-09-11 MED ORDER — LACTATED RINGERS IV SOLN
INTRAVENOUS | Status: DC
  Administered 2015-09-11 (×2): via INTRAVENOUS

## 2015-09-11 NOTE — Anesthesia Preprocedure Evaluation (Addendum)
Anesthesia Evaluation  Patient identified by MRN, date of birth, ID band Patient awake    Reviewed: Allergy & Precautions, H&P , Patient's Chart, lab work & pertinent test results  Airway Mallampati: II  TM Distance: >3 FB Neck ROM: full    Dental  (+) Teeth Intact   Pulmonary    breath sounds clear to auscultation       Cardiovascular  Rhythm:regular Rate:Normal     Neuro/Psych    GI/Hepatic   Endo/Other    Renal/GU      Musculoskeletal   Abdominal   Peds  Hematology  (+) anemia ,   Anesthesia Other Findings   Plts 137    Reproductive/Obstetrics (+) Pregnancy                            Anesthesia Physical Anesthesia Plan  ASA: II  Anesthesia Plan: Epidural   Post-op Pain Management:    Induction:   Airway Management Planned:   Additional Equipment:   Intra-op Plan:   Post-operative Plan:   Informed Consent: I have reviewed the patients History and Physical, chart, labs and discussed the procedure including the risks, benefits and alternatives for the proposed anesthesia with the patient or authorized representative who has indicated his/her understanding and acceptance.   Dental Advisory Given  Plan Discussed with:   Anesthesia Plan Comments: (Labs checked- platelets confirmed with RN in room. Fetal heart tracing, per RN, reported to be stable enough for sitting procedure. Discussed epidural, and patient consents to the procedure:  included risk of possible headache,backache, failed block, allergic reaction, and nerve injury. This patient was asked if she had any questions or concerns before the procedure started.)        Anesthesia Quick Evaluation

## 2015-09-11 NOTE — Progress Notes (Signed)
Patient ID: [name], female   DOB: 1990-05-29, 25 y.o.   MRN: [id number] Pt has received her epidural and is comfortable. The contractions have spaced out some with the epidural. The cervix is 8-9 cm 90% effaced and the vertex is at - 2 station. AROM produced clear fluid.

## 2015-09-11 NOTE — H&P (Signed)
[name], [name]              ACCOUNT NO.:  [id number]  MEDICAL RECORD NO.:  [id number]  LOCATION:  9164                          FACILITY:  [facility]  PHYSICIAN:  Malachi Pro. Ambrose Mantle, M.D. DATE OF BIRTH:  [birth year]  DATE OF ADMISSION:  09/11/2015 DATE OF DISCHARGE:                             HISTORY & PHYSICAL   PRESENT ILLNESS:  This is a 25 year old black female, para 1-0-0-1, gravida 2, EDC September 20, 2015, admitted at 8-cm dilatation.  Blood group and type O positive, negative antibody, RPR negative, urine culture negative, hepatitis B surface antigen negative, HIV negative, GC and Chlamydia negative.  Rubella not immune.  Hemoglobin electrophoresis AA.  Pap test normal.  One-hour Glucola 80.  Repeat GC and Chlamydia negative.  Group B strep negative.  The patient began her prenatal course in our office on April 20, 2015.  She was started on Zoloft for depression.  This made her depression manageable.  She had transportation issues and use Medicaid transportation forgetting to the appointments.  She was a habitual no shower.  She was advised that if she did not attend her visit, she would be discharged.  At her last prenatal visit, the cervix was 1 cm, 30%, vertex at a -3 station.  She began contracting a day initially between 6 and 8 minutes apart, subsequently that came to 3 minutes.  She came to the hospital, found to be 8 cm dilated, and was admitted.  PAST MEDICAL HISTORY:  Chickenpox as a child, never smoked, is not draining.  Denies illicit drugs.  Had wisdom teeth extracted.  Otherwise no surgical history.  No known drug allergies.  FAMILY HISTORY:  Mother, no problems.  PHYSICAL EXAMINATION:  GENERAL:  On admission reveals a well-developed, well-nourished black female, in labor. VITAL SIGNS:  Blood pressure 126/68, pulse 71, respirations 20, temperature 98.1. HEART:  Normal size and sounds.  No murmurs. LUNGS:  Clear to auscultation. ABDOMEN:   Soft. GENITOURINARY:  At her last prenatal visit, her fundal height was 38 cm. According to the admitting RN, the cervix is 8 cm.  Vertex presentation.  ADMITTING IMPRESSION:  Intrauterine pregnancy at 38 weeks and 5 days with active labor.  The patient is admitted.  She requested an epidural __________ will be determined.     Malachi Pro. Ambrose Mantle, M.D.     TFH/MEDQ  D:  09/11/2015  T:  09/11/2015  Job:  [id number]

## 2015-09-11 NOTE — MAU Note (Signed)
Started contracting this morning, getting closer and stronger.  Was about 2 cm when last checked. No bleeding or leaking. Denies problems with preg.

## 2015-09-11 NOTE — MAU Note (Signed)
Per HMitchell, RN charge, pt to go to room 164 

## 2015-09-11 NOTE — Progress Notes (Signed)
Patient ID: [name], female   DOB: [birth year], 25 y.o.   MRN: [id number] Delivery note:  She became fully dilated, pushed well and delivered a living female infant spontaneously LOA over a basically intact perineum. There was 1 loop of nuchal cord. The Apgars were 8 and 9 at 1 and 5 minutes. The placenta was intact and the uterus was normal. There were small perineal, labial and periurethral lacerations that were hemostatic and not repaired.EBL 100 cc's

## 2015-09-12 ENCOUNTER — Encounter (HOSPITAL_COMMUNITY): Payer: Self-pay | Admitting: *Deleted

## 2015-09-12 LAB — CBC
HCT: 27.8 % — ABNORMAL LOW (ref 36.0–46.0)
Hemoglobin: 9 g/dL — ABNORMAL LOW (ref 12.0–15.0)
MCH: 26.9 pg (ref 26.0–34.0)
MCHC: 32.4 g/dL (ref 30.0–36.0)
MCV: 83 fL (ref 78.0–100.0)
PLATELETS: 137 10*3/uL — AB (ref 150–400)
RBC: 3.35 MIL/uL — AB (ref 3.87–5.11)
RDW: 14.4 % (ref 11.5–15.5)
WBC: 13.3 10*3/uL — AB (ref 4.0–10.5)

## 2015-09-12 LAB — RPR: RPR: NONREACTIVE

## 2015-09-12 MED ORDER — MEASLES, MUMPS & RUBELLA VAC ~~LOC~~ INJ
0.5000 mL | INJECTION | Freq: Once | SUBCUTANEOUS | Status: AC
  Administered 2015-09-13: 0.5 mL via SUBCUTANEOUS
  Filled 2015-09-12 (×2): qty 0.5

## 2015-09-12 MED ORDER — ONDANSETRON HCL 4 MG/2ML IJ SOLN: 4.0000 mg | INTRAMUSCULAR | Status: DC | PRN

## 2015-09-12 MED ORDER — TETANUS-DIPHTH-ACELL PERTUSSIS 5-2.5-18.5 LF-MCG/0.5 IM SUSP: 0.5000 mL | Freq: Once | INTRAMUSCULAR | Status: DC

## 2015-09-12 MED ORDER — PRENATAL MULTIVITAMIN CH
1.0000 | ORAL_TABLET | Freq: Every day | ORAL | Status: DC
  Administered 2015-09-12 – 2015-09-13 (×2): 1 via ORAL
  Filled 2015-09-12 (×2): qty 1

## 2015-09-12 MED ORDER — LACTATED RINGERS IV SOLN: INTRAVENOUS | Status: AC

## 2015-09-12 MED ORDER — FERROUS SULFATE 325 (65 FE) MG PO TABS
325.0000 mg | ORAL_TABLET | Freq: Three times a day (TID) | ORAL | Status: DC
  Administered 2015-09-12 – 2015-09-13 (×4): 325 mg via ORAL
  Filled 2015-09-12 (×4): qty 1

## 2015-09-12 MED ORDER — OXYCODONE-ACETAMINOPHEN 5-325 MG PO TABS
1.0000 | ORAL_TABLET | ORAL | Status: DC | PRN
Start: 2015-09-12 — End: 2015-09-13
  Administered 2015-09-12 – 2015-09-13 (×4): 1 via ORAL
  Filled 2015-09-12 (×3): qty 1

## 2015-09-12 MED ORDER — WITCH HAZEL-GLYCERIN EX PADS: 1.0000 "application " | MEDICATED_PAD | CUTANEOUS | Status: DC | PRN

## 2015-09-12 MED ORDER — PRENATAL MULTIVITAMIN CH: 2.0000 | ORAL_TABLET | Freq: Every day | ORAL | Status: DC

## 2015-09-12 MED ORDER — OXYCODONE-ACETAMINOPHEN 5-325 MG PO TABS
2.0000 | ORAL_TABLET | ORAL | Status: DC | PRN
  Filled 2015-09-12: qty 2

## 2015-09-12 MED ORDER — ZOLPIDEM TARTRATE 5 MG PO TABS: 5.0000 mg | ORAL_TABLET | Freq: Every evening | ORAL | Status: DC | PRN

## 2015-09-12 MED ORDER — ACETAMINOPHEN 325 MG PO TABS: 650.0000 mg | ORAL_TABLET | ORAL | Status: DC | PRN

## 2015-09-12 MED ORDER — SIMETHICONE 80 MG PO CHEW: 80.0000 mg | CHEWABLE_TABLET | ORAL | Status: DC | PRN

## 2015-09-12 MED ORDER — SENNOSIDES-DOCUSATE SODIUM 8.6-50 MG PO TABS
2.0000 | ORAL_TABLET | ORAL | Status: DC
  Administered 2015-09-12 – 2015-09-13 (×2): 2 via ORAL
  Filled 2015-09-12 (×2): qty 2

## 2015-09-12 MED ORDER — DIBUCAINE 1 % RE OINT: 1.0000 "application " | TOPICAL_OINTMENT | RECTAL | Status: DC | PRN

## 2015-09-12 MED ORDER — LANOLIN HYDROUS EX OINT: TOPICAL_OINTMENT | CUTANEOUS | Status: DC | PRN

## 2015-09-12 MED ORDER — ONDANSETRON HCL 4 MG PO TABS: 4.0000 mg | ORAL_TABLET | ORAL | Status: DC | PRN

## 2015-09-12 MED ORDER — BENZOCAINE-MENTHOL 20-0.5 % EX AERO
1.0000 "application " | INHALATION_SPRAY | CUTANEOUS | Status: DC | PRN
  Administered 2015-09-12: 1 via TOPICAL
  Filled 2015-09-12: qty 56

## 2015-09-12 MED ORDER — DIPHENHYDRAMINE HCL 25 MG PO CAPS: 25.0000 mg | ORAL_CAPSULE | Freq: Four times a day (QID) | ORAL | Status: DC | PRN

## 2015-09-12 MED ORDER — OXYTOCIN 40 UNITS IN LACTATED RINGERS INFUSION - SIMPLE MED: 62.5000 mL/h | INTRAVENOUS | Status: AC

## 2015-09-12 NOTE — Progress Notes (Signed)
Called Dr Jackelyn Knife for order for Zoloft that patient had been on during her pregnancy. Patient states she does not feel the need for Zoloft as she stopped it in July or August. States she will not take it now. Social worker talked with her about history of depression.

## 2015-09-12 NOTE — Progress Notes (Signed)
Patient ID: [name], female   DOB: 29-Jul-1990, 25 y.o.   MRN: [id number] #1 afebrile BP normal HGB 9.6 to 9.0

## 2015-09-12 NOTE — Lactation Note (Signed)
This note was copied from the chart of Janet [place] Frenette. Lactation Consultation Note  Patient Name: [name] Today's Date: 09/12/2015 Reason for consult: Follow-up assessment   Follow-up at 31 hours old; Mom is a P2 experience for 6 months with first child.   Infant has breastfed 6-9 times (10-60 min) since birth in 17 hours + attempts x3; still no voids and no stools.   Mom stated she needed to awaken infant for feeding since her last feeding was 4 hours ago.  LC handed baby to mom and with unwrapping, the baby began awaking and showing cues to feed.   Mom attempted to latch using cradle hold, but baby would not open mouth.  LC showed mom how to use cross-cradle hold with asymmetrical latching technique.  Infant latched and sucked in a rhythmical sucking motion.   Educated mom to keep feeding with cues and to put baby STS if baby is sleeping for long periods of time.  Educated on cluster feeding and encouraged mom to continue exclusive breastfeeding. Mom was still feeding infant when [facility] left room.  Visitors had arrived.   Encouraged mom to call for assistance as needed.     Maternal Data Formula Feeding for Exclusion: No  Feeding Feeding Type: Breast Fed  LATCH Score/Interventions Latch: Grasps breast easily, tongue down, lips flanged, rhythmical sucking.  Audible Swallowing: A few with stimulation  Type of Nipple: Everted at rest and after stimulation  Comfort (Breast/Nipple): Soft / non-tender     Hold (Positioning): Assistance needed to correctly position infant at breast and maintain latch. Intervention(s): Breastfeeding basics reviewed;Support Pillows;Skin to skin;Position options  LATCH Score: 8  Lactation Tools Discussed/Used     Consult Status Consult Status: Follow-up Date: 09/13/15 Follow-up type: In-patient    Lendon Ka 09/12/2015, 2:42 PM

## 2015-09-12 NOTE — Clinical Social Work Maternal (Signed)
CLINICAL SOCIAL WORK MATERNAL/CHILD NOTE  Patient Details  Name: [name] MRN: [id number] Date of Birth: 02/18/1990  Date:  11/13/2015  Clinical Social Worker Initiating Note:  Loleta Books, LCSW Date/ Time Initiated:  09/12/15/1100     Child's Name:  [name]   Legal Guardian:  Mother   Need for Interpreter:  None   Date of Referral:  2015/12/06     Reason for Referral:  History of depression  Referral Source:  [facility]   Address:  [address] [place], [place] [address]  Phone number:  [phone]   Household Members:  Hord Children, Significant Other   Natural Supports (not living in the home):  Immediate Family, Extended Family   Professional Supports: None   Employment: Full-time   Type of Work:   N/A  Education:    N/A  Surveyor, quantity Resources:  Medicaid   Other Resources:  [employer]   Cultural/Religious Considerations Which May Impact Care:  None reported  Strengths:  Ability to meet basic needs , Merchandiser, retail , Home prepared for child    Risk Factors/Current Problems:   1)Mental Health Concerns: MOB presents with onset of depressive symptoms during the pregnancy. She stated that she was prescribed Zoloft in June/July, but discontinued the medication after 1-2 days.  MOB reported that she then started to focus on personal changes to reduce symptoms, and shared that once she reached out to her support system, she experienced decrease in symptoms. MOB denied any recent symptoms of depression, but continues to present with increased risk for developing PPD.   Cognitive State:  Able to Concentrate , Alert , Insightful , Goal Oriented , Linear Thinking    Mood/Affect:  Happy , Interested , Calm , Comfortable    CSW Assessment:  CSW received request for consult due to MOB presenting with a history of depression.  MOB presented as easily engaged and receptive to the visit. She displayed a full range in affect and presented in a pleasant mood. She  was noted to be caring for and attending to the infant, and smiled when she interacted with her. The FOB was also present in the room, but he did not participate in the assessment. MOB did not present with any acute mental health symptoms, and her comments highlight self-awareness and ability to self-regulate. MOB also vocalized motivation to seek out help if needs arise postpartum.   MOB endorsed feelings of excitement and happiness secondary to the infant's birth. She stated that she is looking forward to transitioning home with her, and endorsed presence of strong family support as she adjusts to being a mother of two children.  MOB reported that she will be starting to work in November, and shared that she will be "busy" as she prepares to return to work.  MOB did not identify additional psychosocial stressors that may negatively impact her transition postpartum.   Per MOB, she experienced onset of depressive symptoms during the pregnancy.  She denied prior history of depression, but shared that during the pregnancy she noted that she was feeling alone and isolated since she was home alone with her 62 year old daughter. MOB shared that she loves her daughter, but noted that she was more emotional and tearful since she had minimal contact with adults, felt that she was engaged in a mundane routine, and never had an opportunity to relax.  She stated that she was prescribed Zoloft in June/July, but only took "1 to 2 times" since she did not like how she  felt. MOB reported that she then chose to problem solve and identify how to improve her symptoms on her own. She shared that since she felt alone and isolated, she would reach out to her family and friends. MOB discussed how reaching out to her family reduced symptoms since they came over more and talked with her.  She also stated that she realized the importance of changing her routine when she notes mundane routine.   Per MOB, these changes helped to reduce  symptoms, and she denied presence of any lingering symptoms of depression.  MOB presented as receptive to exploring how to utilize what she has previously learned to her transition postpartum.  MOB was noted to be attentive and engaged as CSW provided education on perinatal mood and anxiety disorders, and she agreed to contact her medical provider if she notes that normative methods of coping are not effective.   MOB expressed appreciation for the visit and support, and agreed to contact CSW if needs arise.   CSW Plan/Description:   1)Patient/Family Education: Perinatal mood and anxiety disorders 2)No Further Intervention Required/No Barriers to Discharge    Kelby Fam 20-Jan-2015, 12:56 PM

## 2015-09-12 NOTE — Anesthesia Postprocedure Evaluation (Signed)
  Anesthesia Post-op Note  Patient: [name]  Procedure(s) Performed: * No procedures listed *  Patient Location: Mother/Baby  Anesthesia Type:Epidural  Level of Consciousness: awake  Airway and Oxygen Therapy: Patient Spontanous Breathing  Post-op Pain: mild  Post-op Assessment: Patient's Cardiovascular Status Stable and Respiratory Function Stable              Post-op Vital Signs: stable  Last Vitals:  Filed Vitals:   09/12/15 0436  BP: 101/47  Pulse: 64  Temp: 37.1 C  Resp: 18    Complications: No apparent anesthesia complications

## 2015-09-12 NOTE — Lactation Note (Signed)
This note was copied from the chart of [name]. Lactation Consultation Note Experienced BF for 6 months to her 25 yr old. Denied any challenges. Mom has long tubular soft breast w/good everted nipples. Hand expressed easy flow of clear colostrum from nipples. Discussed positions, newborn behavior, I&O, supply and demand. Mom encouraged to feed baby 8-12 times/24 hours and with feeding cues. Mom encouraged to do skin-to-skin.Referred to Baby and Me Book in Breastfeeding section Pg. 22-23 for position options and Proper latch demonstration.Reviewed Baby & Me book's Breastfeeding Basics.  Patient Name: Janet [place] Trachtenberg Today's Date: 09/12/2015 Reason for consult: Initial assessment   Maternal Data Has patient been taught Hand Expression?: Yes Does the patient have breastfeeding experience prior to this delivery?: Yes  Feeding Feeding Type: Breast Fed Length of feed: 15 min (still BF)  LATCH Score/Interventions Latch: Grasps breast easily, tongue down, lips flanged, rhythmical sucking. Intervention(s): Adjust position;Assist with latch;Breast massage;Breast compression  Audible Swallowing: Spontaneous and intermittent Intervention(s): Skin to skin;Hand expression;Alternate breast massage  Type of Nipple: Everted at rest and after stimulation  Comfort (Breast/Nipple): Soft / non-tender     Hold (Positioning): Assistance needed to correctly position infant at breast and maintain latch. Intervention(s): Skin to skin;Position options;Support Pillows;Breastfeeding basics reviewed  LATCH Score: 9  Lactation Tools Discussed/Used WIC Program: Yes   Consult Status Consult Status: Follow-up Date: 09/13/15 Follow-up type: In-patient    Hertha Gergen, Diamond Nickel 09/12/2015, 4:05 AM

## 2015-09-13 MED ORDER — FERROUS SULFATE 325 (65 FE) MG PO TABS: 325.0000 mg | ORAL_TABLET | Freq: Three times a day (TID) | ORAL | Status: DC

## 2015-09-13 MED ORDER — IBUPROFEN 600 MG PO TABS: 600.0000 mg | ORAL_TABLET | Freq: Four times a day (QID) | ORAL | Status: DC | PRN

## 2015-09-13 MED ORDER — FERROUS SULFATE 325 (65 FE) MG PO TABS: 325.0000 mg | ORAL_TABLET | Freq: Two times a day (BID) | ORAL | Status: DC

## 2015-09-13 MED ORDER — OXYCODONE-ACETAMINOPHEN 5-325 MG PO TABS: 1.0000 | ORAL_TABLET | Freq: Four times a day (QID) | ORAL | Status: DC | PRN

## 2015-09-13 NOTE — Lactation Note (Signed)
This note was copied from the chart of [name]. Lactation Consultation Note  P2, Ex BF.  Mother states she had an abundance of breastmilk w/ older child. Baby has been cluster feeding.  Nipples tender.  Provided comfort gels and discussed applying ebm, olive oil and coconut oil. Discussed engorgement care and monitoring voids/stools. Mother requested DEBP kit and provided her w/ hand pump. Denies problems w/ breastfeeding.  RN states breastfeeding going well.  Patient Name: [name] Today's Date: 09/13/2015     Maternal Data    Feeding Feeding Type: Breast Fed Length of feed: 7 min  LATCH Score/Interventions Latch: Grasps breast easily, tongue down, lips flanged, rhythmical sucking.  Audible Swallowing: Spontaneous and intermittent Intervention(s): Skin to skin  Type of Nipple: Everted at rest and after stimulation  Comfort (Breast/Nipple): Soft / non-tender     Hold (Positioning): No assistance needed to correctly position infant at breast. Intervention(s): Skin to skin;Support Pillows  LATCH Score: 10  Lactation Tools Discussed/Used     Consult Status      Carlye Grippe 09/13/2015, 9:36 AM

## 2015-09-13 NOTE — Progress Notes (Signed)
Patient ID: [name], female   DOB: 03-09-1990, 25 y.o.   MRN: [id number] #[id number] afebrile BP normal for d/c

## 2015-09-13 NOTE — Discharge Instructions (Signed)
booklet °

## 2015-09-14 NOTE — Discharge Summary (Signed)
NAMEHENRITTA, MUTZ NO.:  [id number]  MEDICAL RECORD NO.:  [id number]  LOCATION:  9129                          FACILITY:  [facility]  PHYSICIAN:  Malachi Pro. Ambrose Mantle, M.D. DATE OF BIRTH:  Oct 18, 1990  DATE OF ADMISSION:  09/11/2015 DATE OF DISCHARGE:  09/13/2015                              DISCHARGE SUMMARY   HISTORY OF PRESENT ILLNESS:  A 25 year old black female, para 1-0-0-1, gravida 2, EDC September 20, 2015, admitted, 8 cm dilatation.  She received an epidural and became comfortable.  Her contractions spaced out.  Artificial rupture of the membranes produced clear fluid at 8-9 cm.  She became fully dilated, pushed well, and delivered a living female infant spontaneously LOA over basically intact perineum.  There was 1 loop of nuchal cord.  Apgars were 8 and 9 at 1 and 5 minutes. Placenta was intact.  Uterus normal.  Small perineal, labial, and periurethral lacerations were hemostatic and not repaired.  Blood loss about 100 mL.  Postpartum, the patient did well.  She was anemic on admission and that was stable on the second postpartum day.  She was afebrile.  Blood pressure was normal.  Having no complaints and was ready for discharge.  Initial hemoglobin 9.6, hematocrit 29.1, white count 10,600, platelet count 157,000.  Followup hemoglobin was 9.0.  FINAL DIAGNOSES:  Intrauterine pregnancy at 38+ weeks, delivered left occiput anterior.  OPERATION:  Spontaneous delivery, LOA.  FINAL CONDITION:  Improved.  INSTRUCTIONS:  Include our regular discharge instruction booklet. Prescriptions for ferrous sulfate 325 mg twice daily for 3 months, Percocet 5/325, 30 tablets, 1 every 6 hours as needed for pain, Motrin 600 mg 30 tablets, 1 every 6 hours as needed for pain.  The patient is to return to the office in 6 weeks for followup examination.     Malachi Pro. Ambrose Mantle, M.D.     TFH/MEDQ  D:  09/13/2015  T:  09/14/2015  Job:  [id number]

## 2015-09-22 ENCOUNTER — Encounter (INDEPENDENT_AMBULATORY_CARE_PROVIDER_SITE_OTHER): Payer: Self-pay

## 2015-10-05 MED ORDER — FENTANYL 2.5 MCG/ML BUPIVACAINE 1/10 % EPIDURAL INFUSION (WH - ANES)
INTRAMUSCULAR | Status: DC | PRN
  Administered 2015-09-11: 14 mL/h via EPIDURAL

## 2015-10-05 MED ORDER — LIDOCAINE HCL (PF) 1 % IJ SOLN
INTRAMUSCULAR | Status: DC | PRN
  Administered 2015-09-11: 4 mL via EPIDURAL
  Administered 2015-09-11: 6 mL via EPIDURAL

## 2015-10-05 NOTE — Anesthesia Procedure Notes (Signed)

## 2015-11-29 ENCOUNTER — Emergency Department (HOSPITAL_COMMUNITY)
Admission: EM | Admit: 2015-11-29 | Discharge: 2015-11-29 | Disposition: A | Discharge status: Home / Self Care | Payer: Medicaid Other | Source: Home / Self Care | Attending: Emergency Medicine | Admitting: Emergency Medicine

## 2015-11-29 ENCOUNTER — Encounter (HOSPITAL_COMMUNITY): Payer: Self-pay | Admitting: Emergency Medicine

## 2015-11-29 DIAGNOSIS — A084 Viral intestinal infection, unspecified: Secondary | ICD-10-CM | POA: Diagnosis not present

## 2015-11-29 DIAGNOSIS — B349 Viral infection, unspecified: Secondary | ICD-10-CM | POA: Diagnosis not present

## 2015-11-29 DIAGNOSIS — R1111 Vomiting without nausea: Secondary | ICD-10-CM | POA: Diagnosis not present

## 2015-11-29 DIAGNOSIS — A09 Infectious gastroenteritis and colitis, unspecified: Secondary | ICD-10-CM | POA: Diagnosis not present

## 2015-11-29 DIAGNOSIS — R197 Diarrhea, unspecified: Secondary | ICD-10-CM

## 2015-11-29 MED ORDER — ONDANSETRON HCL 4 MG PO TABS: 4.0000 mg | ORAL_TABLET | Freq: Four times a day (QID) | ORAL | Status: DC

## 2015-11-29 NOTE — ED Notes (Signed)
C/o abd pain onset yest associated w/BA, HA, chills, decreased appetite, diarrhea, emesis A&O x4... No acute distress.

## 2015-11-29 NOTE — ED Provider Notes (Signed)
CSN: [id number]     Arrival date & time 11/29/15  1558 History   First MD Initiated Contact with Patient 11/29/15 1635     Chief Complaint  Patient presents with  . Abdominal Pain   (Consider location/radiation/quality/duration/timing/severity/associated sxs/prior Treatment) HPI Comments: [age] female presenting with fatigue, malaise, vomiting, diarrhea, headache, body aches, chills and subjective fever at home since chest today. She has had 2 episodes of vomiting which occurred yesterday only. She has had diarrhea for 2 days including today.  Patient is a 25 y.o. female presenting with abdominal pain.  Abdominal Pain Associated symptoms: diarrhea, fever, nausea and vomiting   Associated symptoms: no chest pain, no cough and no shortness of breath     Past Medical History  Diagnosis Date  . Anemia   . Hx of wisdom tooth extraction 2011  . Depression     Was on Zoloft   Past Surgical History  Procedure Laterality Date  . Wisdom tooth extraction  2011   Family History  Problem Relation Age of Onset  . Asthma Sister   . Asthma Brother    Social History  Substance Use Topics  . Smoking status: Never Smoker   . Smokeless tobacco: Never Used  . Alcohol Use: No     Comment: not since pregnant, but before that on weekends   OB History    Gravida Para Term Preterm AB TAB SAB Ectopic Multiple Living   0 2     Review of Systems  Constitutional: Positive for fever, activity change and appetite change.  HENT: Negative.   Respiratory: Negative for cough and shortness of breath.   Cardiovascular: Negative for chest pain.  Gastrointestinal: Positive for nausea, vomiting and diarrhea. Negative for abdominal pain.  Genitourinary: Negative.   Musculoskeletal: Positive for myalgias.  Skin: Negative for rash.  All other systems reviewed and are negative.   Allergies  Review of patient's allergies indicates no known allergies.  Home Medications   Prior to Admission  medications   Medication Sig Start Date End Date Taking? Authorizing Provider  ferrous sulfate 325 (65 FE) MG tablet Take 325 mg by mouth daily.    Historical Provider, MD  ferrous sulfate 325 (65 FE) MG tablet Take 1 tablet (325 mg total) by mouth 3 (three) times daily with meals. 09/13/15   Tracey Harries, MD  ferrous sulfate 325 (65 FE) MG tablet Take 1 tablet (325 mg total) by mouth 2 (two) times daily with a meal. 09/13/15   Tracey Harries, MD  ibuprofen (ADVIL,MOTRIN) 600 MG tablet Take 1 tablet (600 mg total) by mouth every 6 (six) hours as needed. 09/13/15   Tracey Harries, MD  ondansetron (ZOFRAN) 4 MG tablet Take 1 tablet (4 mg total) by mouth every 6 (six) hours. 11/29/15   Hayden Rasmussen, NP  oxyCODONE-acetaminophen (PERCOCET/ROXICET) 5-325 MG per tablet Take 1 tablet by mouth every 6 (six) hours as needed (for pain scale 4-7). 09/13/15   Tracey Harries, MD  Prenatal Vit-Fe Fumarate-FA (PRENATAL MULTIVITAMIN) TABS Take 1 tablet by mouth daily.     Historical Provider, MD   Meds Ordered and Administered this Visit  Medications - No data to display  BP 125/77 mmHg  Pulse 85  Temp(Src) 98.2 F (36.8 C) (Oral)  Resp 18  SpO2 100%  LMP 11/27/2015 No data found.   Physical Exam  Constitutional: She is oriented to person, place, and time. She appears well-developed and well-nourished. No distress.  HENT:  Oropharynx with Romano erythema and clear PND. No exudates  Eyes: Conjunctivae and EOM are normal.  Neck: Normal range of motion. Neck supple.  Cardiovascular: Normal rate, regular rhythm and normal heart sounds.   Pulmonary/Chest: Effort normal and breath sounds normal. No respiratory distress. She has no wheezes.  Abdominal: Soft. Bowel sounds are normal. There is no tenderness. There is no rebound and no guarding.  Musculoskeletal: Normal range of motion.  Lymphadenopathy:    She has no cervical adenopathy.  Neurological: She is alert and oriented to person, place, and time. She  exhibits normal muscle tone.  Skin: Skin is warm and dry. No rash noted.  Psychiatric: She has a normal mood and affect.  Nursing note and vitals reviewed.   ED Course  Procedures (including critical care time)  Labs Review Labs Reviewed - No data to display  Imaging Review No results found.   Visual Acuity Review  Right Eye Distance:   Left Eye Distance:   Bilateral Distance:    Right Eye Near:   Left Eye Near:    Bilateral Near:         MDM   1. Viral syndrome   2. Non-intractable vomiting without nausea, vomiting of unspecified type   3. Diarrhea of presumed infectious origin   4. Viral gastroenteritis    home, rest, lots of clear liquids, Gatorade in small frequent amounts. Zofran for nausea and vomiting. Tylenol for fever. May take one Imodium now for diarrhea. If after 4-6 hours you are having more than 4-6 diarrhea stools he may take 1 more. Do not stop your diarrhea.      Hayden Rasmussenavid Asmi Fugere, NP 11/29/15 1700

## 2015-11-29 NOTE — Discharge Instructions (Signed)
Diarrhea Diarrhea is frequent loose and watery bowel movements. It can cause you to feel weak and dehydrated. Dehydration can cause you to become tired and thirsty, have a dry mouth, and have decreased urination that often is dark yellow. Diarrhea is a sign of another problem, most often an infection that will not last long. In most cases, diarrhea typically lasts 2-3 days. However, it can last longer if it is a sign of something more serious. It is important to treat your diarrhea as directed by your caregiver to lessen or prevent future episodes of diarrhea. CAUSES  Some common causes include:  Gastrointestinal infections caused by viruses, bacteria, or parasites.  Food poisoning or food allergies.  Certain medicines, such as antibiotics, chemotherapy, and laxatives.  Artificial sweeteners and fructose.  Digestive disorders. HOME CARE INSTRUCTIONS 1. Ensure adequate fluid intake (hydration): Have 1 cup (8 oz) of fluid for each diarrhea episode. Avoid fluids that contain simple sugars or sports drinks, fruit juices, whole milk products, and sodas. Your urine should be clear or pale yellow if you are drinking enough fluids. Hydrate with an oral rehydration solution that you can purchase at pharmacies, retail stores, and online. You can prepare an oral rehydration solution at home by mixing the following ingredients together: 1.  - tsp table salt. 2.  tsp baking soda. 3.  tsp salt substitute containing potassium chloride. 4. 1  tablespoons sugar. 5. 1 L (34 oz) of water. 2. Certain foods and beverages may increase the speed at which food moves through the gastrointestinal (GI) tract. These foods and beverages should be avoided and include: 1. Caffeinated and alcoholic beverages. 2. High-fiber foods, such as raw fruits and vegetables, nuts, seeds, and whole grain breads and cereals. 3. Foods and beverages sweetened with sugar alcohols, such as xylitol, sorbitol, and mannitol. 3. Some foods  may be well tolerated and may help thicken stool including: 1. Starchy foods, such as rice, toast, pasta, low-sugar cereal, oatmeal, grits, baked potatoes, crackers, and bagels. 2. Bananas. 3. Applesauce. 4. Add probiotic-rich foods to help increase healthy bacteria in the GI tract, such as yogurt and fermented milk products. 5. Wash your hands well after each diarrhea episode. 6. Only take over-the-counter or prescription medicines as directed by your caregiver. 7. Take a warm bath to relieve any burning or pain from frequent diarrhea episodes. SEEK IMMEDIATE MEDICAL CARE IF:   You are unable to keep fluids down.  You have persistent vomiting.  You have blood in your stool, or your stools are black and tarry.  You do not urinate in 6-8 hours, or there is only a small amount of very dark urine.  You have abdominal pain that increases or localizes.  You have weakness, dizziness, confusion, or light-headedness.  You have a severe headache.  Your diarrhea gets worse or does not get better.  You have a fever or persistent symptoms for more than 2-3 days.  You have a fever and your symptoms suddenly get worse. MAKE SURE YOU:   Understand these instructions.  Will watch your condition.  Will get help right away if you are not doing well or get worse.   This information is not intended to replace advice given to you by your health care provider. Make sure you discuss any questions you have with your health care provider.   Document Released: 12/07/2002 Document Revised: 01/07/2015 Document Reviewed: 08/24/2012 Elsevier Interactive Patient Education 2016 [employer].  Food Choices to Help Relieve Diarrhea, Adult When you have diarrhea,  the foods you eat and your eating habits are very important. Choosing the right foods and drinks can help relieve diarrhea. Also, because diarrhea can last up to 7 days, you need to replace lost fluids and electrolytes (such as sodium, potassium,  and chloride) in order to help prevent dehydration.  WHAT GENERAL GUIDELINES DO I NEED TO FOLLOW?  Slowly drink 1 cup (8 oz) of fluid for each episode of diarrhea. If you are getting enough fluid, your urine will be clear or pale yellow.  Eat starchy foods. Some good choices include white rice, white toast, pasta, low-fiber cereal, baked potatoes (without the skin), saltine crackers, and bagels.  Avoid large servings of any cooked vegetables.  Limit fruit to two servings per day. A serving is  cup or 1 small piece.  Choose foods with less than 2 g of fiber per serving.  Limit fats to less than 8 tsp (38 g) per day.  Avoid fried foods.  Eat foods that have probiotics in them. Probiotics can be found in certain dairy products.  Avoid foods and beverages that may increase the speed at which food moves through the stomach and intestines (gastrointestinal tract). Things to avoid include:  High-fiber foods, such as dried fruit, raw fruits and vegetables, nuts, seeds, and whole grain foods.  Spicy foods and high-fat foods.  Foods and beverages sweetened with high-fructose corn syrup, honey, or sugar alcohols such as xylitol, sorbitol, and mannitol. WHAT FOODS ARE RECOMMENDED? Grains White rice. White, [place], or pita breads (fresh or toasted), including plain rolls, buns, or bagels. White pasta. Saltine, soda, or graham crackers. Pretzels. Low-fiber cereal. Cooked cereals made with water (such as cornmeal, farina, or cream cereals). Plain muffins. Matzo. Melba toast. Zwieback.  Vegetables Potatoes (without the skin). Strained tomato and vegetable juices. Most well-cooked and canned vegetables without seeds. Tender lettuce. Fruits Cooked or canned applesauce, apricots, cherries, fruit cocktail, grapefruit, peaches, pears, or plums. Fresh bananas, apples without skin, cherries, grapes, cantaloupe, grapefruit, peaches, oranges, or plums.  Meat and Other Protein Products Baked or boiled  chicken. Eggs. Tofu. Fish. Seafood. Smooth peanut butter. Ground or well-cooked tender beef, ham, veal, lamb, pork, or poultry.  Dairy Plain yogurt, kefir, and unsweetened liquid yogurt. Lactose-free milk, buttermilk, or soy milk. Plain hard cheese. Beverages Sport drinks. Clear broths. Diluted fruit juices (except prune). Regular, caffeine-free sodas such as ginger ale. Water. Decaffeinated teas. Oral rehydration solutions. Sugar-free beverages not sweetened with sugar alcohols. Other Bouillon, broth, or soups made from recommended foods.  The items listed above may not be a complete list of recommended foods or beverages. Contact your dietitian for more options. WHAT FOODS ARE NOT RECOMMENDED? Grains Whole grain, whole wheat, bran, or rye breads, rolls, pastas, crackers, and cereals. Wild or brown rice. Cereals that contain more than 2 g of fiber per serving. Corn tortillas or taco shells. Cooked or dry oatmeal. Granola. Popcorn. Vegetables Raw vegetables. Cabbage, broccoli, Brussels sprouts, artichokes, baked beans, beet greens, corn, kale, legumes, peas, sweet potatoes, and yams. Potato skins. Cooked spinach and cabbage. Fruits Dried fruit, including raisins and dates. Raw fruits. Stewed or dried prunes. Fresh apples with skin, apricots, mangoes, pears, raspberries, and strawberries.  Meat and Other Protein Products Chunky peanut butter. Nuts and seeds. Beans and lentils. Tomasa BlaseBacon.  Dairy High-fat cheeses. Milk, chocolate milk, and beverages made with milk, such as milk shakes. Cream. Ice cream. Sweets and Desserts Sweet rolls, doughnuts, and sweet breads. Pancakes and waffles. Fats and Oils Butter. Cream sauces. Margarine.  Salad oils. Plain salad dressings. Olives. Avocados.  Beverages Caffeinated beverages (such as coffee, tea, soda, or energy drinks). Alcoholic beverages. Fruit juices with pulp. Prune juice. Soft drinks sweetened with high-fructose corn syrup or sugar  alcohols. Other Coconut. Hot sauce. Chili powder. Mayonnaise. Gravy. Cream-based or milk-based soups.  The items listed above may not be a complete list of foods and beverages to avoid. Contact your dietitian for more information. WHAT SHOULD I DO IF I BECOME DEHYDRATED? Diarrhea can sometimes lead to dehydration. Signs of dehydration include dark urine and dry mouth and skin. If you think you are dehydrated, you should rehydrate with an oral rehydration solution. These solutions can be purchased at pharmacies, retail stores, or online.  Drink -1 cup (120-240 mL) of oral rehydration solution each time you have an episode of diarrhea. If drinking this amount makes your diarrhea worse, try drinking smaller amounts more often. For example, drink 1-3 tsp (5-15 mL) every 5-10 minutes.  A general rule for staying hydrated is to drink 1-2 L of fluid per day. Talk to your health care provider about the specific amount you should be drinking each day. Drink enough fluids to keep your urine clear or pale yellow.   This information is not intended to replace advice given to you by your health care provider. Make sure you discuss any questions you have with your health care provider.   Document Released: 03/08/2004 Document Revised: 01/07/2015 Document Reviewed: 11/09/2013 Elsevier Interactive Patient Education 2016 [employer] Inc[employer]  Nausea and Vomiting Nausea is a sick feeling that often comes before throwing up (vomiting). Vomiting is a reflex where stomach contents come out of your mouth. Vomiting can cause severe loss of body fluids (dehydration). Children and elderly adults can become dehydrated quickly, especially if they also have diarrhea. Nausea and vomiting are symptoms of a condition or disease. It is important to find the cause of your symptoms. CAUSES   Direct irritation of the stomach lining. This irritation can result from increased acid production (gastroesophageal reflux disease), infection,  food poisoning, taking certain medicines (such as nonsteroidal anti-inflammatory drugs), alcohol use, or tobacco use.  Signals from the brain.These signals could be caused by a headache, heat exposure, an inner ear disturbance, increased pressure in the brain from injury, infection, a tumor, or a concussion, pain, emotional stimulus, or metabolic problems.  An obstruction in the gastrointestinal tract (bowel obstruction).  Illnesses such as diabetes, hepatitis, gallbladder problems, appendicitis, kidney problems, cancer, sepsis, atypical symptoms of a heart attack, or eating disorders.  Medical treatments such as chemotherapy and radiation.  Receiving medicine that makes you sleep (general anesthetic) during surgery. DIAGNOSIS Your caregiver may ask for tests to be done if the problems do not improve after a few days. Tests may also be done if symptoms are severe or if the reason for the nausea and vomiting is not clear. Tests may include: 8. Urine tests. 9. Blood tests. 10. Stool tests. 11. Cultures (to look for evidence of infection). 12. X-rays or other imaging studies. Test results can help your caregiver make decisions about treatment or the need for additional tests. TREATMENT You need to stay well hydrated. Drink frequently but in small amounts.You may wish to drink water, sports drinks, clear broth, or eat frozen ice pops or gelatin dessert to help stay hydrated.When you eat, eating slowly may help prevent nausea.There are also some antinausea medicines that may help prevent nausea. HOME CARE INSTRUCTIONS   Take all medicine as directed by your caregiver.  If you do not have an appetite, do not force yourself to eat. However, you must continue to drink fluids.  If you have an appetite, eat a normal diet unless your caregiver tells you differently.  Eat a variety of complex carbohydrates (rice, wheat, potatoes, bread), lean meats, yogurt, fruits, and vegetables.  Avoid  high-fat foods because they are more difficult to digest.  Drink enough water and fluids to keep your urine clear or pale yellow.  If you are dehydrated, ask your caregiver for specific rehydration instructions. Signs of dehydration may include:  Severe thirst.  Dry lips and mouth.  Dizziness.  Dark urine.  Decreasing urine frequency and amount.  Confusion.  Rapid breathing or pulse. SEEK IMMEDIATE MEDICAL CARE IF:   You have blood or brown flecks (like coffee grounds) in your vomit.  You have black or bloody stools.  You have a severe headache or stiff neck.  You are confused.  You have severe abdominal pain.  You have chest pain or trouble breathing.  You do not urinate at least once every 8 hours.  You develop cold or clammy skin.  You continue to vomit for longer than 24 to 48 hours.  You have a fever. MAKE SURE YOU:   Understand these instructions.  Will watch your condition.  Will get help right away if you are not doing well or get worse.   This information is not intended to replace advice given to you by your health care provider. Make sure you discuss any questions you have with your health care provider.   Document Released: 12/17/2005 Document Revised: 03/10/2012 Document Reviewed: 05/16/2011 Elsevier Interactive Patient Education 2016 [employer].  Infection Control in the Home If you have an infection or you are taking care of someone who has an infection, it is important to know how to keep the infection from spreading. Follow these guidelines to help stop the spread of infection, and talk to your health care provider. HOW ARE INFECTIONS SPREAD? In order for an infection to spread, the following must be present:  A germ. This may be a virus, bacteria, fungus, or parasite.  A place for the germ to live. This may be:  On or in a person, animal, plant, or food.  In soil or water.  On surfaces, such as a door handle.  A susceptible  host. This is a person or animal who does not have resistance (immunity) to the germ.  A way for the germ to enter the host. This may occur by:  Direct contact. This may happen by making contact--such as shaking hands or hugging--with an infected person or animal. Some germs can also travel through the air and spread to you if an infected person coughs or sneezes on you or near you.  Indirect contact. This is when the germ enters the host through contact with an infected object. Examples include eating contaminated food, drinking contaminated water, or touching a contaminated surface with your hands and then touching your face, nose, or mouth soon after that. HOW CAN I HELP TO PREVENT INFECTION FROM SPREADING? There are several things that you can do to help prevent infection from spreading. Hand Washing It is very important to wash your hands correctly, following these steps: 13. Wet your hands with clean, running water. 14. Apply soap to your hands. Liquid soap is better than bar soap. 15. Rub your hands together quickly to create lather. 16. Keep rubbing your hands together for at least 20 seconds. Thoroughly  scrub all parts of your hands, including under your fingernails and between your fingers. 17. Rinse your hands with clean, running water until all of the soap is gone. 18. Dry your hands with an air dryer or a clean paper or cloth towel, or let your hands air-dry. Do not use your clothing or a soiled towel to dry your hands. 19. If you are in a public restroom, use your towel to turn off the water faucet and to open the bathroom door. Make sure to wash your hands:  Before:  Visiting a baby or anyone with a weakened or lowered defense (immune) system.  Putting in and taking out any contact lenses.  After:  Working or playing outside.  Touching an animal or its toys or leash.  Handling livestock.  Using the bathroom or helping a child or adult to use the bathroom.  Using  household cleaners or toxic chemicals.  Touching or taking out the garbage.  Touching anything dirty around your home.  Handling soiled clothes or rags.  Taking care of a sick child. This includes touching used tissues, toys, and clothes.  Sneezing, coughing, or blowing your nose.  Using public transportation.  Shaking hands.  Using a phone, including your mobile phone.  Touching money.  Before and after:  Preparing food.  Preparing a bottle for a baby.  Feeding a baby or a young child.  Eating.  Visiting or taking care of someone who is sick.  Changing a diaper.  Changing a bandage (dressing) or taking care of an injury or wound.  Giving or taking medicine. Taking Care of Your Home  Make sure that you have enough cleaning supplies at all times. These include:  Disinfectants.  Reusable cleaning cloths. Wash these after each use.  Paper towels.  Utility gloves. Replace your gloves if they are cracked or torn or if they start to peel.  Use bleach safely. Never mix it with other cleaning products, especially those that contain ammonia. This mixture can create a dangerous gas that may be deadly.  Take care of your cleaning supplies. Toilet brushes, mops, and sponges can breed germs. Soak them in bleach and water for 5 minutes after each use.  Do not pour used mop water down the sink. Pour it down the toilet instead.  Maintain proper ventilation in your home.  If you have a pet, ensure that your pet stays clean. Do not let people with weak immune systems touch bird droppings, fish tank water, or a litter box.  If you have a cat, be sure to change the litter every day.  In the bathroom, make sure you:  Provide liquid soap.  Change towels and washcloths frequently. Avoid sharing towels and washcloths.  Change toothbrushes often and store them separately in a clean, dry place.  Disinfect the toilet.  Clean the tub, shower, and sink with standard cleaning  products.   Mop the floor with a standard cleaner.  Do not share personal items, such as razors, toothbrushes, drinking glasses, deodorant, combs, brushes, towels, and washcloths.   In the kitchen, make sure you:  Store food carefully.  Refrigerate leftovers promptly in covered containers.  Throw out stale or spoiled food.  Clean the inside of your refrigerator each week.  Keep your refrigerator set at 57F (4C) or less, and set your freezer at 86F (-18C) or less.  Thaw foods in the refrigerator or microwave, not at room temperature.  Serve foods at the proper temperature. Do not eat raw  meat. Make sure it is cooked to the appropriate temperature.Cook eggs until they are firm.  Wash fruits and vegetables under running water.  Use separate cutting boards, plates, and utensils for raw foods and cooked foods.  Keep work surfaces clean.  Use a clean spoon each time you sample food while cooking.  Wash your dishes in hot, soapy water. Air-dry your dishes or use a dishwasher.  Do not share forks, cups, or spoons during meals.  Wear gloves if laundry is visibly soiled.  Change linens each week or whenever they are soiled.  Do not shake soiled linens. Doing that may send germs into the air. Put dressings, sanitary or incontinence pads, diapers, and gloves in plastic garbage bags for disposal.   This information is not intended to replace advice given to you by your health care provider. Make sure you discuss any questions you have with your health care provider.   Document Released: 09/25/2008 Document Revised: 01/07/2015 Document Reviewed: 08/19/2014 Elsevier Interactive Patient Education [employer]

## 2016-02-05 ENCOUNTER — Encounter (HOSPITAL_COMMUNITY): Payer: Self-pay

## 2016-02-05 ENCOUNTER — Emergency Department (INDEPENDENT_AMBULATORY_CARE_PROVIDER_SITE_OTHER): Payer: Medicaid Other

## 2016-02-05 ENCOUNTER — Emergency Department (INDEPENDENT_AMBULATORY_CARE_PROVIDER_SITE_OTHER)
Admission: EM | Admit: 2016-02-05 | Discharge: 2016-02-05 | Disposition: A | Discharge status: Home / Self Care | Payer: Medicaid Other | Source: Home / Self Care | Attending: Family Medicine | Admitting: Family Medicine

## 2016-02-05 DIAGNOSIS — T1490 Injury, unspecified: Secondary | ICD-10-CM

## 2016-02-05 DIAGNOSIS — S81801A Unspecified open wound, right lower leg, initial encounter: Secondary | ICD-10-CM

## 2016-02-05 DIAGNOSIS — M25561 Pain in right knee: Secondary | ICD-10-CM

## 2016-02-05 DIAGNOSIS — M25511 Pain in right shoulder: Secondary | ICD-10-CM | POA: Diagnosis not present

## 2016-02-05 DIAGNOSIS — R52 Pain, unspecified: Secondary | ICD-10-CM

## 2016-02-05 DIAGNOSIS — M79671 Pain in right foot: Secondary | ICD-10-CM

## 2016-02-05 DIAGNOSIS — T1490XA Injury, unspecified, initial encounter: Secondary | ICD-10-CM

## 2016-02-05 MED ORDER — TRAMADOL HCL 50 MG PO TABS: 50.0000 mg | ORAL_TABLET | Freq: Four times a day (QID) | ORAL | Status: DC | PRN

## 2016-02-05 NOTE — ED Notes (Signed)
Patient states she fell down the steps about three days ago Complains of right sided pain to her arm knee and foot Right knee has a few cuts around the area

## 2016-02-05 NOTE — Discharge Instructions (Signed)
It was nice seeing you today. I am sorry about your knee pain. Your xray is negative for fracture or dislocation. You can apply warm compression on your right knee. We have cleaned and dressed your wound. Please keep area clean and dry, Use Tramadol as needed for pain. Do some home exercise for your shoulder to improve range of motion.See [place] back soon if no improvement.   Shoulder Range of Motion Exercises Shoulder range of motion (ROM) exercises are designed to keep the shoulder moving freely. They are often recommended for people who have shoulder pain. MOVEMENT EXERCISE When you are able, do this exercise 5-6 days per week, or as told by your health care provider. Work toward doing 2 sets of 10 swings. Pendulum Exercise How To Do This Exercise Lying Down 1. Lie face-down on a bed with your abdomen close to the side of the bed. 2. Let your arm hang over the side of the bed. 3. Relax your shoulder, arm, and hand. 4. Slowly and gently swing your arm forward and back. Do not use your neck muscles to swing your arm. They should be relaxed. If you are struggling to swing your arm, have someone gently swing it for you. When you do this exercise for the first time, swing your arm at a 15 degree angle for 15 seconds, or swing your arm 10 times. As pain lessens over time, increase the angle of the swing to 30-45 degrees. 5. Repeat steps 1-4 with the other arm. How To Do This Exercise While Standing 1. Stand next to a sturdy chair or table and hold on to it with your hand.  Bend forward at the waist.  Bend your knees slightly.  Relax your other arm and let it hang limp.  Relax the shoulder blade of the arm that is hanging and let it drop.  While keeping your shoulder relaxed, use body motion to swing your arm in small circles. The first time you do this exercise, swing your arm for about 30 seconds or 10 times. When you do it next time, swing your arm for a little longer.  Stand up tall and  relax.  Repeat steps 1-7, this time changing the direction of the circles. 2. Repeat steps 1-8 with the other arm. STRETCHING EXERCISES Do these exercises 3-4 times per day on 5-6 days per week or as told by your health care provider. Work toward holding the stretch for 20 seconds. Stretching Exercise 1 1. Lift your arm straight out in front of you. 2. Bend your arm 90 degrees at the elbow (right angle) so your forearm goes across your body and looks like the letter "L." 3. Use your other arm to gently pull the elbow forward and across your body. 4. Repeat steps 1-3 with the other arm. Stretching Exercise 2 You will need a towel or rope for this exercise. 1. Bend one arm behind your back with the palm facing outward. 2. Hold a towel with your other hand. 3. Reach the arm that holds the towel above your head, and bend that arm at the elbow. Your wrist should be behind your neck. 4. Use your free hand to grab the free end of the towel. 5. With the higher hand, gently pull the towel up behind you. 6. With the lower hand, pull the towel down behind you. 7. Repeat steps 1-6 with the other arm. STRENGTHENING EXERCISES Do each of these exercises at four different times of day (sessions) every day or as  told by your health care provider. To begin with, repeat each exercise 5 times (repetitions). Work toward doing 3 sets of 12 repetitions or as told by your health care provider. Strengthening Exercise 1 You will need a light weight for this activity. As you grow stronger, you may use a heavier weight. 1. Standing with a weight in your hand, lift your arm straight out to the side until it is at the same height as your shoulder. 2. Bend your arm at 90 degrees so that your fingers are pointing to the ceiling. 3. Slowly raise your hand until your arm is straight up in the air. 4. Repeat steps 1-3 with the other arm. Strengthening Exercise 2 You will need a light weight for this activity. As you grow  stronger, you may use a heavier weight. 1. Standing with a weight in your hand, gradually move your straight arm in an arc, starting at your side, then out in front of you, then straight up over your head. 2. Gradually move your other arm in an arc, starting at your side, then out in front of you, then straight up over your head. 3. Repeat steps 1-2 with the other arm. Strengthening Exercise 3 You will need an elastic band for this activity. As you grow stronger, gradually increase the size of the bands or increase the number of bands that you use at one time. 1. While standing, hold an elastic band in one hand and raise that arm up in the air. 2. With your other hand, pull down the band until that hand is by your side. 3. Repeat steps 1-2 with the other arm.   This information is not intended to replace advice given to you by your health care provider. Make sure you discuss any questions you have with your health care provider.   Document Released: 09/15/2003 Document Revised: 05/03/2015 Document Reviewed: 12/13/2014 Elsevier Interactive Patient Education [employer]

## 2016-02-05 NOTE — ED Provider Notes (Addendum)
CSN: [id number]     Arrival date & time 02/05/16  1638 History   First MD Initiated Contact with Patient 02/05/16 1648     No chief complaint on file.  (Consider location/radiation/quality/duration/timing/severity/associated sxs/prior Treatment) Patient is a 26 y.o. female presenting with injury. The history is provided by the patient. No language interpreter was used.  Injury This is a new problem. Episode onset: 3 days ago she fell while wrestling around with her brother and then she fell off the stair and her knee went through the wall. The problem has not changed since onset.Associated symptoms comments: Pain on her right knee, she has skin break of her right shin just below the knee.She also has right arm pain. Since 3 days she has been limping. She now has right knee swelling, poor range of motion. She also has pain on her right shoulder with limited range of motion..    Past Medical History  Diagnosis Date  . Anemia   . Hx of wisdom tooth extraction 2011  . Depression     Was on Zoloft   Past Surgical History  Procedure Laterality Date  . Wisdom tooth extraction  2011   Family History  Problem Relation Age of Onset  . Asthma Sister   . Asthma Brother    Social History  Substance Use Topics  . Smoking status: Never Smoker   . Smokeless tobacco: Never Used  . Alcohol Use: No     Comment: not since pregnant, but before that on weekends   OB History    Gravida Para Term Preterm AB TAB SAB Ectopic Multiple Living   0 2     Review of Systems  Respiratory: Negative.   Cardiovascular: Negative.   Musculoskeletal: Positive for joint swelling.       Right shoulder pain, right knee pain and swelling, right foot pain.  All other systems reviewed and are negative.  Filed Vitals:   02/05/16 1650  BP: 109/67  Pulse: 109  Temp: 98.3 F (36.8 C)  TempSrc: Oral  SpO2: 99%    Allergies  Review of patient's allergies indicates no known allergies.  Home  Medications   Prior to Admission medications   Medication Sig Start Date End Date Taking? Authorizing Provider  ferrous sulfate 325 (65 FE) MG tablet Take 325 mg by mouth daily.    Historical Provider, MD  ferrous sulfate 325 (65 FE) MG tablet Take 1 tablet (325 mg total) by mouth 3 (three) times daily with meals. 09/13/15   Tracey Harries, MD  ferrous sulfate 325 (65 FE) MG tablet Take 1 tablet (325 mg total) by mouth 2 (two) times daily with a meal. 09/13/15   Tracey Harries, MD  ibuprofen (ADVIL,MOTRIN) 600 MG tablet Take 1 tablet (600 mg total) by mouth every 6 (six) hours as needed. 09/13/15   Tracey Harries, MD  ondansetron (ZOFRAN) 4 MG tablet Take 1 tablet (4 mg total) by mouth every 6 (six) hours. 11/29/15   Hayden Rasmussen, NP  oxyCODONE-acetaminophen (PERCOCET/ROXICET) 5-325 MG per tablet Take 1 tablet by mouth every 6 (six) hours as needed (for pain scale 4-7). 09/13/15   Tracey Harries, MD  Prenatal Vit-Fe Fumarate-FA (PRENATAL MULTIVITAMIN) TABS Take 1 tablet by mouth daily.     Historical Provider, MD   Meds Ordered and Administered this Visit  Medications - No data to display  There were no vitals taken for this visit. No data found.   Physical Exam  Constitutional: She appears well-developed. No distress.  Cardiovascular: Normal rate, regular rhythm and normal heart sounds.   No murmur heard. Pulmonary/Chest: Effort normal and breath sounds normal. No respiratory distress. She has no wheezes.  Musculoskeletal:       Right shoulder: She exhibits decreased range of motion and tenderness. She exhibits no swelling, no effusion, no crepitus and no deformity.       Right elbow: Normal.      Right wrist: Normal.       Right knee: She exhibits decreased range of motion and swelling. She exhibits no deformity.       Right lower leg: She exhibits tenderness. She exhibits no swelling.       Legs:      Right foot: There is normal range of motion, no swelling, no crepitus, no deformity and no  laceration.       Left foot: Normal.       Feet:  Nursing note and vitals reviewed.   ED Course  Procedures (including critical care time)  Labs Review Labs Reviewed - No data to display  Imaging Review No results found.   Visual Acuity Review  Right Eye Distance:   Left Eye Distance:   Bilateral Distance:    Right Eye Near:   Left Eye Near:    Bilateral Near:      Dg Knee 4 Views W/patella Right  02/05/2016  CLINICAL DATA:  Pt fell down the steps x 3 days ago hurting her rt knee, she has a cut on the rt knee from the fall EXAM: RIGHT KNEE - COMPLETE 4+ VIEW COMPARISON:  None. FINDINGS: There is no evidence of fracture, dislocation, or joint effusion. There is no evidence of arthropathy or other focal bone abnormality. Soft tissues are unremarkable. IMPRESSION: Negative. Electronically Signed   By: Norva Pavlov M.D.   On: 02/05/2016 17:24      MDM  No diagnosis found. Right shoulder pain  Injury - Plan: DG Knee 4 Views W/Patella Right, DG Knee 4 Views W/Patella Right  Pain - Plan: DG Knee 4 Views W/Patella Right, DG Knee 4 Views W/Patella Right  Right knee pain  Avulsion of skin of right lower leg, initial encounter  Right foot pain   Presented with multiple joint pain,mostly concern about her knee with limited range of motion. ROM of her right shoulder reduced mostly due to pain but no deformity. At this point , I don't feel the need to obtain shoulder or foot xray. Right knee xray report reviewed and it is negative for fracture or dislocation.  Tramadol prescribed prn pain. May use warm compression on her knee. I gave home ROM exercise instruction for her right should. SYmptoms should resolve gradually with time. Crutches given to keep weight off right knee for few days.  Her right shin bruise/mild skin avulsion was cleaned and dressed by nursing. Keep area clean and dry. F/U as needed.  Doreene Eland, MD 02/05/16 1737  Doreene Eland, MD 02/05/16  [phone]

## 2016-03-08 ENCOUNTER — Emergency Department (INDEPENDENT_AMBULATORY_CARE_PROVIDER_SITE_OTHER)
Admission: EM | Admit: 2016-03-08 | Discharge: 2016-03-08 | Disposition: A | Discharge status: Home / Self Care | Payer: Medicaid Other | Source: Home / Self Care | Attending: Emergency Medicine | Admitting: Emergency Medicine

## 2016-03-08 ENCOUNTER — Encounter (HOSPITAL_COMMUNITY): Payer: Self-pay | Admitting: Emergency Medicine

## 2016-03-08 DIAGNOSIS — M7581 Other shoulder lesions, right shoulder: Secondary | ICD-10-CM | POA: Diagnosis not present

## 2016-03-08 MED ORDER — IBUPROFEN 600 MG PO TABS: 600.0000 mg | ORAL_TABLET | Freq: Four times a day (QID) | ORAL | Status: DC | PRN

## 2016-03-08 MED ORDER — PREDNISONE 50 MG PO TABS: ORAL_TABLET | ORAL | Status: DC

## 2016-03-08 NOTE — ED Provider Notes (Signed)
CSN: [id number]     Arrival date & time 03/08/16  1622 History   First MD Initiated Contact with Patient 03/08/16 1658     Chief Complaint  Patient presents with  . Extremity Pain   (Consider location/radiation/quality/duration/timing/severity/associated sxs/prior Treatment) HPI  She is a 26 year old woman here for evaluation of right shoulder pain. This is been present for about a week. Pain is located in the top of her shoulder. It is worse with movement, particularly overhead movements. No radiating pain. No injury or trauma. She does work at the [employer] and does a lot of heavy lifting. Not tried any medications.  Past Medical History  Diagnosis Date  . Anemia   . Hx of wisdom tooth extraction 2011  . Depression     Was on Zoloft   Past Surgical History  Procedure Laterality Date  . Wisdom tooth extraction  2011   Family History  Problem Relation Age of Onset  . Asthma Sister   . Asthma Brother    Social History  Substance Use Topics  . Smoking status: Never Smoker   . Smokeless tobacco: Never Used  . Alcohol Use: No     Comment: not since pregnant, but before that on weekends   OB History    Gravida Para Term Preterm AB TAB SAB Ectopic Multiple Living   0 2     Review of Systems As in history of present illness Allergies  Review of patient's allergies indicates no known allergies.  Home Medications   Prior to Admission medications   Medication Sig Start Date End Date Taking? Authorizing Provider  ferrous sulfate 325 (65 FE) MG tablet Take 325 mg by mouth daily.    Historical Provider, MD  ferrous sulfate 325 (65 FE) MG tablet Take 1 tablet (325 mg total) by mouth 3 (three) times daily with meals. 09/13/15   Tracey Harries, MD  ferrous sulfate 325 (65 FE) MG tablet Take 1 tablet (325 mg total) by mouth 2 (two) times daily with a meal. 09/13/15   Tracey Harries, MD  ibuprofen (ADVIL,MOTRIN) 600 MG tablet Take 1 tablet (600 mg total) by mouth every 6  (six) hours as needed for moderate pain. 03/08/16   Charm Rings, MD  ondansetron (ZOFRAN) 4 MG tablet Take 1 tablet (4 mg total) by mouth every 6 (six) hours. 11/29/15   Hayden Rasmussen, NP  oxyCODONE-acetaminophen (PERCOCET/ROXICET) 5-325 MG per tablet Take 1 tablet by mouth every 6 (six) hours as needed (for pain scale 4-7). 09/13/15   Tracey Harries, MD  predniSONE (DELTASONE) 50 MG tablet Take 1 pill daily for 5 days. 03/08/16   Charm Rings, MD  Prenatal Vit-Fe Fumarate-FA (PRENATAL MULTIVITAMIN) TABS Take 1 tablet by mouth daily.     Historical Provider, MD  traMADol (ULTRAM) 50 MG tablet Take 1 tablet (50 mg total) by mouth every 6 (six) hours as needed. 02/05/16   Doreene Eland, MD   Meds Ordered and Administered this Visit  Medications - No data to display  BP 120/73 mmHg  Pulse 66  Temp(Src) 98 F (36.7 C) (Oral)  Resp 16  SpO2 100%  LMP 02/15/2016 (Exact Date)  Breastfeeding? No No data found.   Physical Exam  Constitutional: She is oriented to person, place, and time. She appears well-developed and well-nourished. No distress.  Cardiovascular: Normal rate.   Pulmonary/Chest: Effort normal.  Musculoskeletal:  Right shoulder: No erythema or edema. No bony tenderness. She  is tender over the supraspinatus and biceps tendons. Positive empty can test. Negative Hawkin's. Positive biceps testing.  Neurological: She is alert and oriented to person, place, and time.    ED Course  Procedures (including critical care time)  Labs Review Labs Reviewed - No data to display  Imaging Review No results found.    MDM   1. Rotator cuff tendonitis, right    Symptomatic treatment with ice, prednisone, ibuprofen. Discussed gentle range of motion. Follow-up with sports medicine if not improving in the next 4-5 days. Work note given.    Charm RingsErin J Kerah Hardebeck, MD 03/08/16 [phone]

## 2016-03-08 NOTE — ED Notes (Signed)
[facility] a [facility] right arm pain that has been ongoing for 1 week. The patient could not recall any trauma or injury but says that she does a lot of heavy lifting at her job.

## 2016-03-08 NOTE — Discharge Instructions (Signed)
Your rotator cuff is irritated. Take prednisone daily for 5 days. Use the ibuprofen every 6 hours as needed for pain. Do gentle range of motion of the shoulder at least 3 times a day. Apply ice at least twice a day. If this is not improving by Monday, please follow-up with sports medicine. This will likely take 1-2 weeks to fully resolve.

## 2016-07-26 ENCOUNTER — Emergency Department (HOSPITAL_COMMUNITY)
Admission: EM | Admit: 2016-07-26 | Discharge: 2016-07-26 | Disposition: A | Discharge status: Home / Self Care | Payer: Medicaid Other | Attending: Emergency Medicine | Admitting: Emergency Medicine

## 2016-07-26 ENCOUNTER — Emergency Department (HOSPITAL_COMMUNITY): Payer: Medicaid Other

## 2016-07-26 ENCOUNTER — Encounter (HOSPITAL_COMMUNITY): Payer: Self-pay | Admitting: *Deleted

## 2016-07-26 DIAGNOSIS — M25511 Pain in right shoulder: Secondary | ICD-10-CM | POA: Diagnosis not present

## 2016-07-26 DIAGNOSIS — Z0279 Encounter for issue of other medical certificate: Secondary | ICD-10-CM | POA: Diagnosis present

## 2016-07-26 NOTE — ED Notes (Signed)
Patient able to ambulate independently  

## 2016-07-26 NOTE — ED Triage Notes (Signed)
Pt reports being seen at [facility] recently for shoulder pain, needs a work note stating she is clear to return to work.

## 2016-07-26 NOTE — ED Provider Notes (Signed)
[facility]-EMERGENCY DEPT Provider Note   CSN: [id number] Arrival date & time: 07/26/16  1606  First Provider Contact:  07/26/2016 4:34 PM  By signing my name below, I, Levon Hedger, attest that this documentation has been prepared under the direction and in the presence of non-physician practitioner, Shanna Cisco, PA-C Electronically Signed: Levon Hedger, Scribe. 07/26/2016. 5:19 PM  History   Chief Complaint Chief Complaint  Patient presents with  . Letter for School/Work    HPI [name] is a 26 y.o. female who presents to the Emergency Department for an evaluation of her right shoulder in order to be cleared for work. Pt states she hurt her right shoulder four months ago and describes the pain as aching. She works at the [employer] and where she does heavy lifting which worsens the pain. 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 Pt was seen in March 2017 and was given a week off. After the week was up, she returned to work and continued to take medication. She worked as usual through April, May, and June. Per pt, she has been out of work for one month for an unrelated reason with her children and now needs to be cleared for her shoulder in order to return to work again. Pt denies any numbness or tingling in bilateral hands.   The history is provided by the patient. No language interpreter was used.    Past Medical History:  Diagnosis Date  . Anemia   . Depression    Was on Zoloft  . Hx of wisdom tooth extraction 2011    Patient Active Problem List   Diagnosis Date Noted  . SVD (spontaneous vaginal delivery) 09/12/2015  . Labor and delivery, indication for care 09/11/2015  . Anemia, iron deficiency 03/30/2013  . Supervision of normal intrauterine pregnancy in primigravida 03/06/2013  . Late prenatal care complicating pregnancy 03/06/2013    Past Surgical History:  Procedure Laterality Date  . WISDOM TOOTH EXTRACTION  2011    OB History      Gravida Para Term Preterm AB Living   SAB TAB Ectopic Multiple Live Births         0         Home Medications    Prior to Admission medications   Medication Sig Start Date End Date Taking? Authorizing Provider  ferrous sulfate 325 (65 FE) MG tablet Take 325 mg by mouth daily.    Historical Provider, MD  ferrous sulfate 325 (65 FE) MG tablet Take 1 tablet (325 mg total) by mouth 3 (three) times daily with meals. 09/13/15   Tracey Harries, MD  ferrous sulfate 325 (65 FE) MG tablet Take 1 tablet (325 mg total) by mouth 2 (two) times daily with a meal. 09/13/15   Tracey Harries, MD  ibuprofen (ADVIL,MOTRIN) 600 MG tablet Take 1 tablet (600 mg total) by mouth every 6 (six) hours as needed for moderate pain. 03/08/16   Charm Rings, MD  ondansetron (ZOFRAN) 4 MG tablet Take 1 tablet (4 mg total) by mouth every 6 (six) hours. 11/29/15   Hayden Rasmussen, NP  oxyCODONE-acetaminophen (PERCOCET/ROXICET) 5-325 MG per tablet Take 1 tablet by mouth every 6 (six) hours as needed (for pain scale 4-7). 09/13/15   Tracey Harries, MD  predniSONE (DELTASONE) 50 MG tablet Take 1 pill daily for 5 days. 03/08/16   Charm Rings, MD  Prenatal Vit-Fe Fumarate-FA (  PRENATAL MULTIVITAMIN) TABS Take 1 tablet by mouth daily.     Historical Provider, MD  traMADol (ULTRAM) 50 MG tablet Take 1 tablet (50 mg total) by mouth every 6 (six) hours as needed. 02/05/16   Doreene Eland, MD    Family History Family History  Problem Relation Age of Onset  . Asthma Sister   . Asthma Brother     Social History Social History  Substance Use Topics  . Smoking status: Never Smoker  . Smokeless tobacco: Never Used  . Alcohol use No     Comment: not since pregnant, but before that on weekends     Allergies   Review of patient's allergies indicates no known allergies.   Review of Systems Review of Systems  Constitutional: Negative for fever.  Musculoskeletal: Positive for myalgias.  Neurological: Negative for  numbness.     Physical Exam Updated Vital Signs BP 109/65 (BP Location: Right Arm)   Pulse 75   Temp 99.3 F (37.4 C) (Oral)   Resp 18   LMP 07/01/2016   SpO2 100%   Physical Exam  Constitutional: She appears well-developed and well-nourished. No distress.  HENT:  Head: Normocephalic and atraumatic.  Neck: Neck supple.  Cardiovascular: Normal rate and regular rhythm.   No murmur heard. Pulmonary/Chest: Effort normal and breath sounds normal. No respiratory distress.  Musculoskeletal:       Arms: Right shoulder : TTP as depicted in image. Full ROM. Negative empty can test, Negative Neer's, Negative Lift off. No swelling, erythema, or ecchymosis present. No step-off, crepitus, or deformity appreciated. 5/5 muscle strength of RUE. 2+ radial pulse, sensation intact, all compartments soft.   Neurological: She is alert.  Skin: Skin is warm and dry. No rash noted. No erythema.  Psychiatric: She has a normal mood and affect.  Nursing note and vitals reviewed.   ED Treatments / Results  DIAGNOSTIC STUDIES:  Oxygen2 Saturation is 99% on RA, normal by my interpretation.    COORDINATION OF CARE:  5:10 PM Will order DG shoulder. Discussed treatment plan with pt at bedside and pt agreed to plan.  Labs (all labs ordered are listed, but only abnormal results are displayed) Labs Reviewed - No data to display  EKG  EKG Interpretation None       Radiology Dg Shoulder Right  Result Date: 07/26/2016 CLINICAL DATA:  All over right shoulder pain x4 months. 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 Pt was seen at an urgent care in March for the shoulder pain and was told she has a rotator cuff injury but she states they did not do any radiographs EXAM: RIGHT SHOULDER - 2+ VIEW COMPARISON:  None. FINDINGS: No fracture.  No dislocation. Mild narrowing of the distal clavicle is noted which may be developmental. There is also and  unfused acromion, which may still fuse given this patient's age, but may be resolved an os acromiale. Glenohumeral joint is normally spaced and aligned. No bone lesions. Soft tissues are unremarkable. IMPRESSION: 1. No fracture or acute finding. 2. 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 The should be diagnosed after a 27. 3. Mild narrowing of the distal clavicle. This may be developmental. It could reflect distal clavicular osteolysis, which can be seen following trauma or with inflammatory arthropathy such as rheumatoid arthritis. 4. No other abnormality. Electronically Signed   By: Amie Portland M.D.   On: 07/26/2016 17:28   Procedures Procedures (  including critical care time)  Medications Ordered in ED Medications - No data to display   Initial Impression / Assessment and Plan / ED Course  I have reviewed the triage vital signs and the nursing notes.  Pertinent labs & imaging results that were available during my care of the patient were reviewed by me and considered in my medical decision making (see chart for details).  Clinical Course   Janet Cala presents to ED requesting note saying she can return to work for right shoulder. Patient was seen back in March 2017 and given 1 week off at that time. She states she returned to work since then but took a month off for "something with my kids". I informed her that given tenderness to shoulder on exam, I would like to get an x-ray. If x-ray is reassuring, I would provide note saying that her shoulder was cleared for work. Patient then states she needs a note saying that everything was cleared for work. I informed her that we do not provide full physical examinations here in the ER and this would be something that needs to be done in an outpatient setting. Discussed with case manager the best place for patient to get this done as she does not have PCP and money tight - she recommends the [facility] .  This information was given to patient. Xrays reviewed. Ortho follow up if pain persists. If unable to get in with ortho, discuss with PCP at visit. Reasons to return to ED discussed and all questions answered.   Final Clinical Impressions(s) / ED Diagnoses   Final diagnoses:  Shoulder pain, right   I personally performed the services described in this documentation, which was scribed in my presence. The recorded information has been reviewed and is accurate.   New Prescriptions New Prescriptions   No medications on file     [facility], PA-C 07/26/16 1811    Nelva Nay, MD 08/02/16 [phone]

## 2016-07-26 NOTE — Discharge Instructions (Signed)
Please call the [facility] to schedule an appointment [phone].  I would also like you to see either the orthopedist (bone doctor) listed or discuss your shoulder with your primary care provider at the follow up appointment. Return to ER for new or worsening symptoms, any additional concerns.

## 2016-07-26 NOTE — ED Notes (Signed)
Pt transported to xray 

## 2016-08-07 ENCOUNTER — Telehealth (HOSPITAL_BASED_OUTPATIENT_CLINIC_OR_DEPARTMENT_OTHER): Payer: Self-pay | Admitting: Emergency Medicine

## 2016-09-20 ENCOUNTER — Encounter (HOSPITAL_COMMUNITY): Payer: Self-pay | Admitting: *Deleted

## 2016-09-20 ENCOUNTER — Emergency Department (HOSPITAL_COMMUNITY)
Admission: EM | Admit: 2016-09-20 | Discharge: 2016-09-20 | Disposition: A | Discharge status: Home / Self Care | Payer: Medicaid Other | Attending: Emergency Medicine | Admitting: Emergency Medicine

## 2016-09-20 DIAGNOSIS — H6121 Impacted cerumen, right ear: Secondary | ICD-10-CM

## 2016-09-20 DIAGNOSIS — Z79899 Other long term (current) drug therapy: Secondary | ICD-10-CM | POA: Insufficient documentation

## 2016-09-20 DIAGNOSIS — H9201 Otalgia, right ear: Secondary | ICD-10-CM | POA: Diagnosis present

## 2016-09-20 MED ORDER — NEOMYCIN-POLYMYXIN-HC 3.5-10000-1 OT SUSP: 4.0000 [drp] | Freq: Three times a day (TID) | OTIC | 0 refills | Status: DC

## 2016-09-20 MED ORDER — DOCUSATE SODIUM 50 MG/5ML PO LIQD
50.0000 mg | Freq: Once | ORAL | Status: AC
  Administered 2016-09-20: 50 mg via OTIC
  Filled 2016-09-20: qty 10

## 2016-09-20 MED ORDER — CARBAMIDE PEROXIDE 6.5 % OT SOLN: 5.0000 [drp] | Freq: Two times a day (BID) | OTIC | 1 refills | Status: DC

## 2016-09-20 NOTE — ED Provider Notes (Signed)
[facility]-EMERGENCY DEPT Provider Note   CSN: [id number] Arrival date & time: 09/20/16  1532   By signing my name below, I, Clovis Pu, attest that this documentation has been prepared under the direction and in the presence of  Aaran Enberg Camprubi-Soms, PA-C. Electronically Signed: Clovis Pu, ED Scribe. 09/20/16. 4:35 PM.   History   Chief Complaint Chief Complaint  Patient presents with  . Ear Pain    The history is provided by the patient and medical records. No language interpreter was used.  Otalgia  This is a new problem. The current episode started yesterday. There is pain in the right ear. Episode frequency: intermittently. The problem has not changed since onset.There has been no fever. The pain is at a severity of 7/10. The pain is moderate. Associated symptoms include hearing loss and sore throat. Pertinent negatives include no ear discharge, no rhinorrhea, no abdominal pain, no diarrhea, no vomiting, no cough and no rash.   HPI Comments:  [name] is a 26 y.o. female who presents to the Emergency Department complaining of R ear pain x1 day. She describes the pain as 7/10 intermittent "sharp" nonradiating right ear pain, worse with talking and chewing, and unimproved with hydrogen peroxide. Associated symptoms include sore throat and "muffled" hearing. She notes sick contacts and states her children are sick but she was sick before them. Pt denies ear drainage, rhinorrhea, fevers, chills, drooling, trismus, cough, chest pain, SOB, abd pain, n/v/d/c, dysuria, hematuria, myalgias, arthralgias, numbness, tingling, weakness, or rashes.  Pt denies any other complaints at this time. No recent swimming.   Past Medical History:  Diagnosis Date  . Anemia   . Depression    Was on Zoloft  . Hx of wisdom tooth extraction 2011    Patient Active Problem List   Diagnosis Date Noted  . SVD (spontaneous vaginal delivery) 09/12/2015  . Labor and delivery, indication for care 09/11/2015   . Anemia, iron deficiency 03/30/2013  . Supervision of normal intrauterine pregnancy in primigravida 03/06/2013  . Late prenatal care complicating pregnancy 03/06/2013    Past Surgical History:  Procedure Laterality Date  . WISDOM TOOTH EXTRACTION  2011    OB History    Gravida Para Term Preterm AB Living   2 2 1 1   2    SAB TAB Ectopic Multiple Live Births         0 2       Home Medications    Prior to Admission medications   Medication Sig Start Date End Date Taking? Authorizing Provider  ferrous sulfate 325 (65 FE) MG tablet Take 325 mg by mouth daily.    Historical Provider, MD  ferrous sulfate 325 (65 FE) MG tablet Take 1 tablet (325 mg total) by mouth 3 (three) times daily with meals. 09/13/15   Tracey Harries, MD  ferrous sulfate 325 (65 FE) MG tablet Take 1 tablet (325 mg total) by mouth 2 (two) times daily with a meal. 09/13/15   Tracey Harries, MD  ibuprofen (ADVIL,MOTRIN) 600 MG tablet Take 1 tablet (600 mg total) by mouth every 6 (six) hours as needed for moderate pain. 03/08/16   Charm Rings, MD  ondansetron (ZOFRAN) 4 MG tablet Take 1 tablet (4 mg total) by mouth every 6 (six) hours. 11/29/15   Hayden Rasmussen, NP  oxyCODONE-acetaminophen (PERCOCET/ROXICET) 5-325 MG per tablet Take 1 tablet by mouth every 6 (six) hours as needed (for pain scale 4-7). 09/13/15   Tracey Harries, MD  predniSONE (DELTASONE) 50 MG  tablet Take 1 pill daily for 5 days. 03/08/16   Charm RingsErin J Honig, MD  Prenatal Vit-Fe Fumarate-FA (PRENATAL MULTIVITAMIN) TABS Take 1 tablet by mouth daily.     Historical Provider, MD  traMADol (ULTRAM) 50 MG tablet Take 1 tablet (50 mg total) by mouth every 6 (six) hours as needed. 02/05/16   Doreene ElandKehinde T Eniola, MD    Family History Family History  Problem Relation Age of Onset  . Asthma Sister   . Asthma Brother     Social History Social History  Substance Use Topics  . Smoking status: Never Smoker  . Smokeless tobacco: Never Used  . Alcohol use No     Comment: not  since pregnant, but before that on weekends     Allergies   Review of patient's allergies indicates no known allergies.   Review of Systems Review of Systems  Constitutional: Negative for chills and fever.  HENT: Positive for ear pain, hearing loss and sore throat. Negative for drooling, ear discharge, rhinorrhea and trouble swallowing.   Respiratory: Negative for cough and shortness of breath.   Cardiovascular: Negative for chest pain.  Gastrointestinal: Negative for abdominal pain, constipation, diarrhea, nausea and vomiting.  Genitourinary: Negative for dysuria and hematuria.  Musculoskeletal: Negative for arthralgias and myalgias.  Skin: Negative for rash.  Allergic/Immunologic: Negative for immunocompromised state.  Neurological: Negative for weakness and numbness.  Psychiatric/Behavioral: Negative for confusion.  10 Systems reviewed and are negative for acute change except as noted in the HPI.    Physical Exam Updated Vital Signs BP 115/73   Pulse 81   Temp 99.4 F (37.4 C) (Oral)   Resp 18   LMP 08/24/2016   SpO2 100%   Physical Exam  Constitutional: She is oriented to person, place, and time. Vital signs are normal. She appears well-developed and well-nourished.  Non-toxic appearance. No distress.  Afebrile, nontoxic, NAD  HENT:  Head: Normocephalic and atraumatic.  Right Ear: No drainage, swelling or tenderness. A foreign body (cerumen impaction ) is present. Decreased hearing is noted.  Left Ear: Hearing, tympanic membrane, external ear and ear canal normal.  Nose: Nose normal.  Mouth/Throat: Uvula is midline, oropharynx is clear and moist and mucous membranes are normal. No trismus in the jaw. No uvula swelling. Tonsils are 0 on the right. Tonsils are 0 on the left. No tonsillar exudate.  L ear clear. R ear canal without swelling or erythema, no drainage but with cerumen impaction which obscures visualization of the TM. Cerumen attempted to be removed with  curette and ~50% of impaction remained afterwards, most of which was deep within the canal. Nose clear. Oropharynx clear and moist, without uvular swelling or deviation, no trismus or drooling, no tonsillar swelling or erythema, no exudates.   Eyes: Conjunctivae and EOM are normal. Right eye exhibits no discharge. Left eye exhibits no discharge.  Neck: Normal range of motion. Neck supple.  Cardiovascular: Normal rate and intact distal pulses.   Pulmonary/Chest: Effort normal. No respiratory distress.  Abdominal: Normal appearance. She exhibits no distension.  Musculoskeletal: Normal range of motion.  Neurological: She is alert and oriented to person, place, and time. She has normal strength. No sensory deficit.  Skin: Skin is warm, dry and intact. No rash noted.  Psychiatric: She has a normal mood and affect. Her behavior is normal.  Nursing note and vitals reviewed.    ED Treatments / Results  DIAGNOSTIC STUDIES:  Oxygen Saturation is 100% on RA, normal by my interpretation.  COORDINATION OF CARE:  4:28 PM Discussed treatment plan with pt at bedside and pt agreed to plan.  Labs (all labs ordered are listed, but only abnormal results are displayed) Labs Reviewed - No data to display  EKG  EKG Interpretation None       Radiology No results found.  Procedures .Ear Cerumen Removal Date/Time: 09/20/2016 4:26 PM Performed by: Allen Derry Authorized by: Allen Derry   Consent:    Consent obtained:  Verbal   Consent given by:  Patient   Risks discussed:  Incomplete removal, TM perforation and pain   Alternatives discussed:  No treatment and alternative treatment Procedure details:    Location:  R ear   Procedure type: curette   Post-procedure details:    Post-procedure ear inspection: remaining cerumen impaction.   Hearing quality:  Improved   Patient tolerance of procedure:  Tolerated well, no immediate complications  .Ear Cerumen  Removal Date/Time: 09/20/2016 5:32 PM Performed by: Marjean Donna Florencio Hollibaugh Authorized by: Allen Derry   Consent:    Consent obtained:  Verbal   Consent given by:  Patient   Risks discussed:  Infection, incomplete removal and pain   Alternatives discussed:  Alternative treatment and no treatment Procedure details:    Location:  R ear   Procedure type: irrigation   Post-procedure details:    Inspection:  Macerated skin   Hearing quality:  Improved   Patient tolerance of procedure:  Tolerated well, no immediate complications   (including critical care time)  Medications Ordered in ED Medications  docusate (COLACE) 50 MG/5ML liquid 50 mg (50 mg Right Ear Given 09/20/16 1708)     Initial Impression / Assessment and Plan / ED Course  I have reviewed the triage vital signs and the nursing notes.  Pertinent labs & imaging results that were available during my care of the patient were reviewed by me and considered in my medical decision making (see chart for details).  Clinical Course    26 y.o. female here with R otalgia and R sore throat x1 day, some diminished hearing, no drainage. Throat clear, nose clear, R ear with cerumen impaction. Attempted removal of cerumen which removed approx 50% of impaction but still significant amount of cerumen deep within the canal. Will proceed with colace and irrigation. Will reassess shortly. Doubt need for labs/RST.   5:36 PM Cerumen still deeply impacted after irrigation, unable to get all of it out with curette or irrigation. Will send home with debrox drops and antibiotic drops to cover for R otitis externa, but doubt need for PO abx for AOM given lack of fever and short duration of symptoms. Discussed avoidance of putting anything in her ears. Tylenol/motrin for pain. F/up with [facility] in 1wk to establish care and recheck symptoms, if unable to get into their office for f/up then see ENT for f/up in 1wk. I explained the diagnosis and have  given explicit precautions to return to the ER including for any other new or worsening symptoms. The patient understands and accepts the medical plan as it's been dictated and I have answered their questions. Discharge instructions concerning home care and prescriptions have been given. The patient is STABLE and is discharged to home in good condition.   Final Clinical Impressions(s) / ED Diagnoses   Final diagnoses:  Cerumen impaction, right  Otalgia of right ear    New Prescriptions New Prescriptions   CARBAMIDE PEROXIDE (DEBROX) 6.5 % OTIC SOLUTION    Place 5 drops into the right ear  2 (two) times daily. X 5 days   NEOMYCIN-POLYMYXIN-HYDROCORTISONE (CORTISPORIN) 3.5-10000-1 OTIC SUSPENSION    Place 4 drops into the right ear 3 (three) times daily. X 7 days   I personally performed the services described in this documentation, which was scribed in my presence. The recorded information has been reviewed and is accurate.     Ian Cavey Camprubi-Soms, PA-C 09/20/16 1737    Jerelyn Scott, MD 09/20/16 1747

## 2016-09-20 NOTE — Discharge Instructions (Signed)
Do not use Q-tips in your ears. Use debrox drops as directed, which will help you clear the cerumen impaction. Use  antibiotic drops as directed to cover for infection. Gargle warm salt water and spit it out to help with sore throat. Use chloraseptic spray as needed for sore throat. Continue to alternate between Tylenol and Ibuprofen for pain or fever. Followup with [facility] in 5-7 days for recheck of ongoing symptoms and to establish medical care. If you can't get into see someone at [facility], see the ENT doctor listed above for follow up. Return to emergency department for emergent changing or worsening of symptoms.

## 2016-09-20 NOTE — ED Triage Notes (Signed)
Pt reports right ear pain and difficulty hearing since yesterday.

## 2016-09-20 NOTE — ED Notes (Signed)
C/o right ear pain, started with a sharp pain, then became "stopped up". Also states is "getting a sore throat". No redness or exudate noted.

## 2016-09-21 IMAGING — DX DG SHOULDER 2+V*R*
3 series · 3 of 3 positions shown · non-contrast
Comparison: None.

CLINICAL DATA: All over right shoulder pain x4 months. No specific
injury, but pt states she works at a distribution center lifting
boxes repetitively. Pt was seen at an [HOSPITAL] in [REDACTED] for the
shoulder pain and was told she has a rotator cuff injury but she
states they did not do any radiographs

EXAM:
RIGHT SHOULDER - 2+ VIEW

[shoulder grashey]
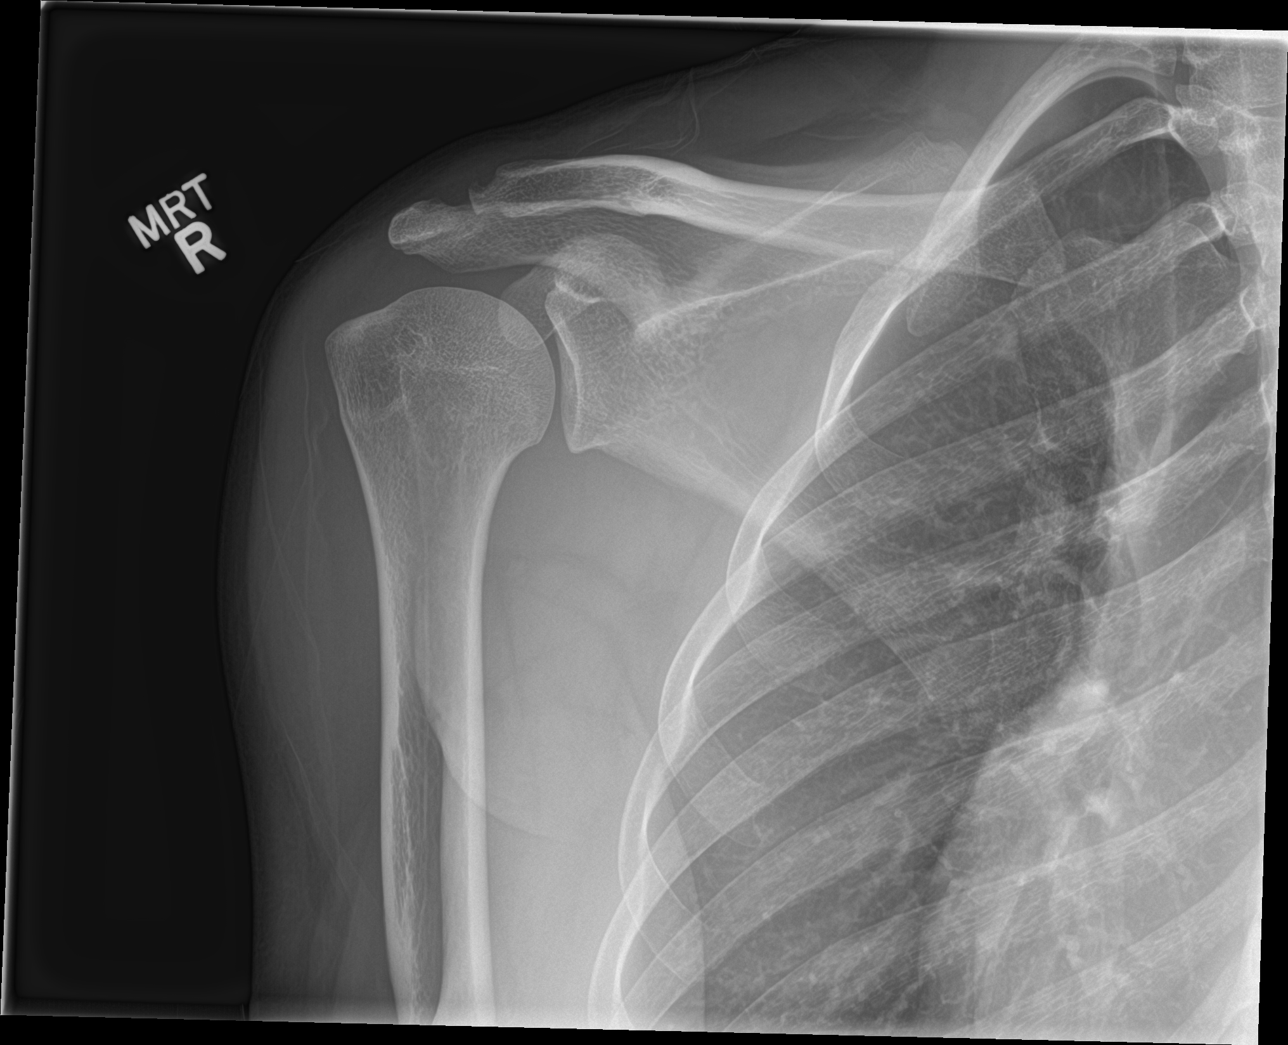

[shoulder y view]
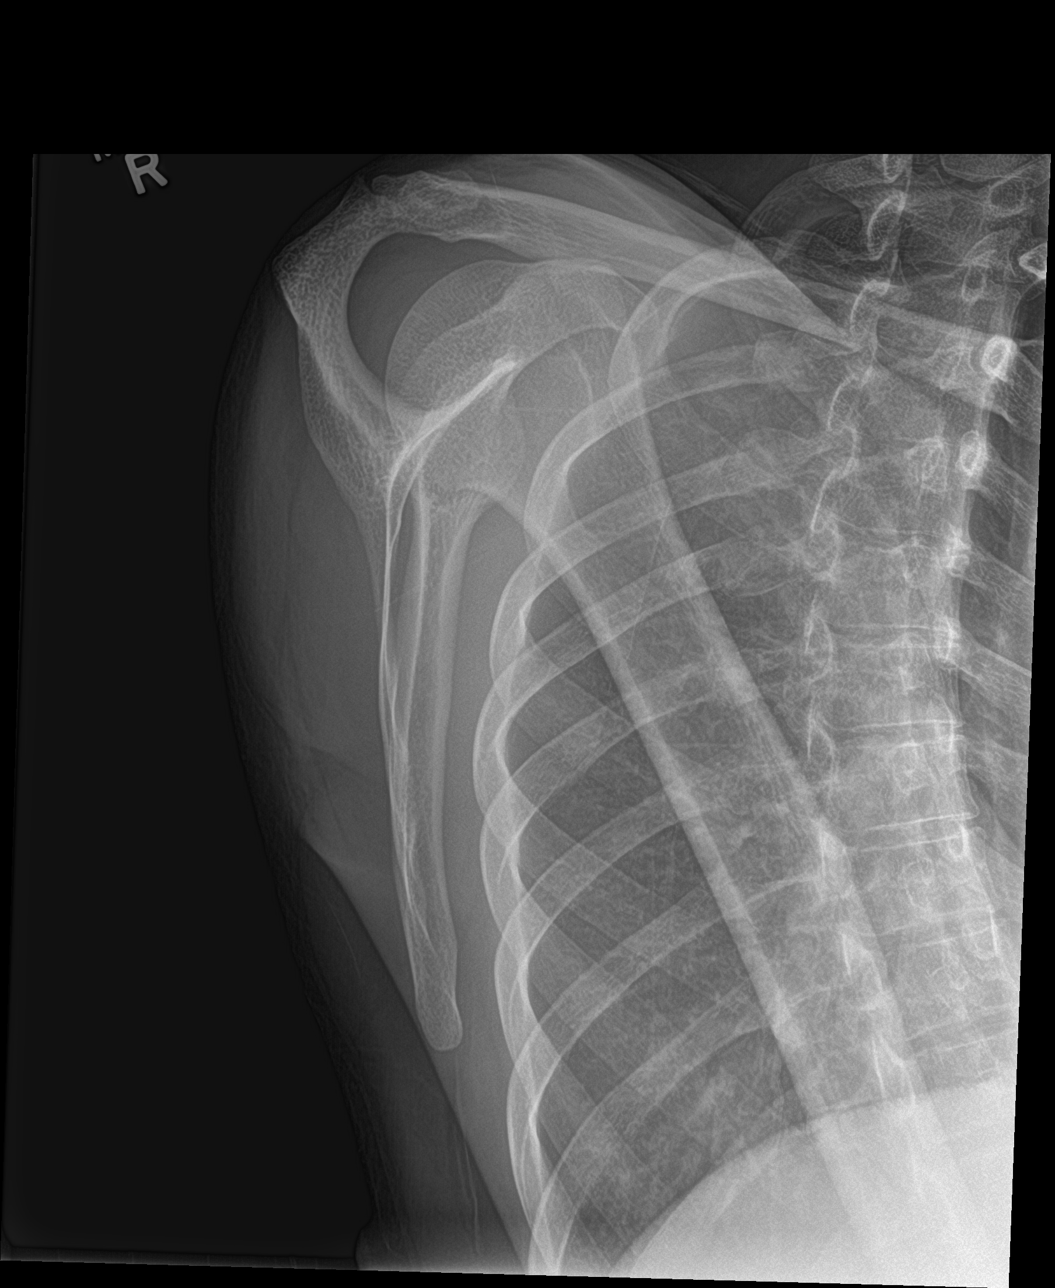

[shoulder axillary]
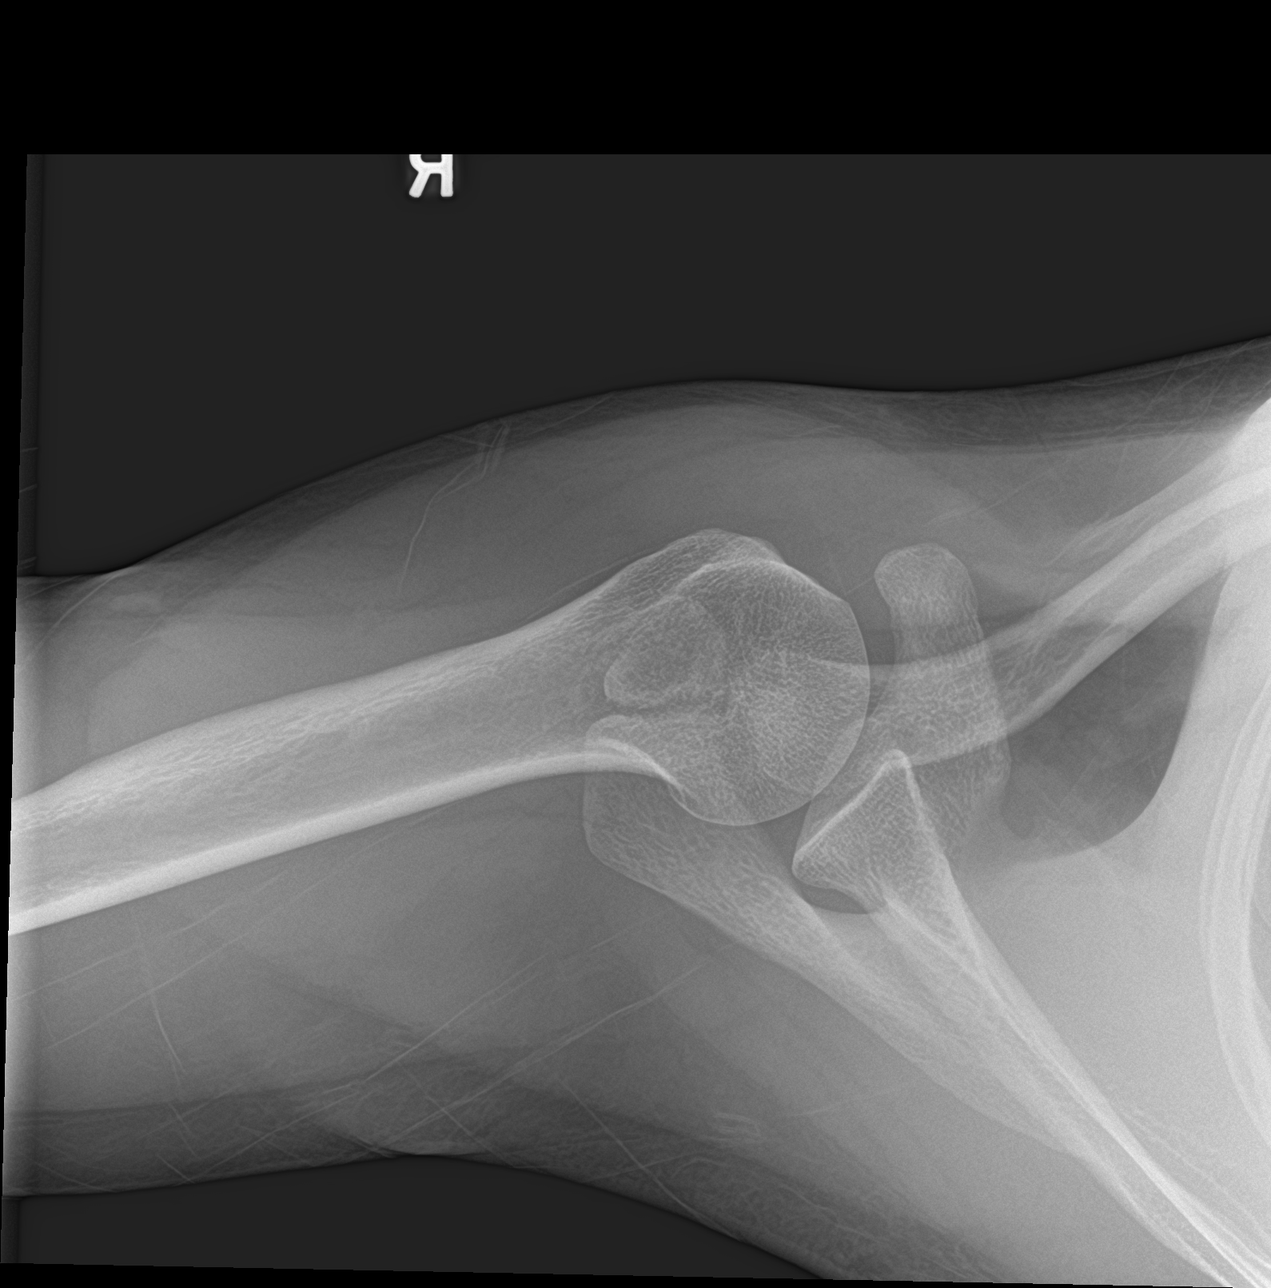

[3 of 3 positions shown; findings below may reference images not displayed]

FINDINGS: No fracture.  No dislocation.

Mild narrowing of the distal clavicle is noted which may be
developmental. There is also and unfused acromion, which may still
fuse given this patient's age, but may be resolved an os acromiale.
Glenohumeral joint is normally spaced and aligned. No bone lesions.
Soft tissues are unremarkable.
IMPRESSION: 1. No fracture or acute finding.
2. Unfused acromion ossification center which may result in the os
acromiale. The should be diagnosed after a 25.
3. Mild narrowing of the distal clavicle. This may be developmental.
It could reflect distal clavicular osteolysis, which can be seen
following trauma or with inflammatory arthropathy such as rheumatoid
arthritis.
4. No other abnormality.

## 2016-12-31 NOTE — L&D Delivery Note (Signed)
Patient is 27 y.o. [id number] [id number] admitted for spontaneous onset of labor.  She progressed to complete without augmentation and had SROM just before delivery.   Delivery Note At 1:38 PM a viable female was delivered via Vaginal, Spontaneous Delivery (Presentation: MOA).  APGAR: 9, 9; weight  pending.   Placenta status: spontaneous, intact.  Cord:  Three vessel   Anesthesia:  Epidural  Episiotomy: None Lacerations:  2nd degree vaginal  Suture Repair: 3.0 vicryl Est. Blood Loss (mL):  500  Mom to postpartum.  Baby to Couplet care / Skin to Skin.   Upon arrival to room, patient was complete and pushing. She pushed with good maternal effort to deliver a viable female infant in cephalic, MOA position. Loose nuchal cord present x1 and baby delivered through. Baby delivered without difficulty, was noted to have good tone and placed on maternal abdomen for oral suctioning, drying and stimulation. Delayed cord clamping performed. Placenta delivered spontaneously with gentle cord traction. Fundus firm with massage and Pitocin. Perineum inspected and found to have a second degree laceration.  Approximately 5cc of 1% Lidocaine was injected for adequate analgesia and laceration was repaired with 3-0 vicryl with good hemostasis achieved. Counts of sharps, instruments, and lap pads were all correct.  Jearld LeschMary K Key, DO PGY-2 09/06/2017, 2:12 PM  OB FELLOW DELIVERY ATTESTATION  I was gloved and present for the delivery in its entirety, and I agree with the above resident's note.    Frederik PearJulie P Marshaun Lortie, MD OB Fellow 4:15 PM

## 2017-04-10 ENCOUNTER — Other Ambulatory Visit (HOSPITAL_COMMUNITY): Payer: Self-pay | Admitting: Advanced Practice Midwife

## 2017-04-10 ENCOUNTER — Inpatient Hospital Stay (HOSPITAL_COMMUNITY)
Admission: AD | Admit: 2017-04-10 | Discharge: 2017-04-10 | Disposition: A | Discharge status: Home / Self Care | Payer: Medicaid Other | Source: Ambulatory Visit | Attending: Obstetrics & Gynecology | Admitting: Obstetrics & Gynecology

## 2017-04-10 ENCOUNTER — Encounter (HOSPITAL_COMMUNITY): Payer: Self-pay | Admitting: *Deleted

## 2017-04-10 DIAGNOSIS — O26892 Other specified pregnancy related conditions, second trimester: Secondary | ICD-10-CM | POA: Insufficient documentation

## 2017-04-10 DIAGNOSIS — R109 Unspecified abdominal pain: Secondary | ICD-10-CM | POA: Diagnosis not present

## 2017-04-10 DIAGNOSIS — Z3A16 16 weeks gestation of pregnancy: Secondary | ICD-10-CM | POA: Insufficient documentation

## 2017-04-10 DIAGNOSIS — O26899 Other specified pregnancy related conditions, unspecified trimester: Secondary | ICD-10-CM

## 2017-04-10 DIAGNOSIS — Z79899 Other long term (current) drug therapy: Secondary | ICD-10-CM | POA: Diagnosis not present

## 2017-04-10 LAB — URINALYSIS, ROUTINE W REFLEX MICROSCOPIC
Bacteria, UA: NONE SEEN
Bilirubin Urine: NEGATIVE
GLUCOSE, UA: NEGATIVE mg/dL
HGB URINE DIPSTICK: NEGATIVE
KETONES UR: NEGATIVE mg/dL
LEUKOCYTES UA: NEGATIVE
NITRITE: NEGATIVE
PH: 7 (ref 5.0–8.0)
PROTEIN: NEGATIVE mg/dL
Specific Gravity, Urine: 1.012 (ref 1.005–1.030)

## 2017-04-10 LAB — WET PREP, GENITAL
Sperm: NONE SEEN
Trich, Wet Prep: NONE SEEN
Yeast Wet Prep HPF POC: NONE SEEN

## 2017-04-10 MED ORDER — METRONIDAZOLE 500 MG PO TABS: 500.0000 mg | ORAL_TABLET | Freq: Two times a day (BID) | ORAL | 0 refills | Status: DC

## 2017-04-10 MED ORDER — METRONIDAZOLE 500 MG PO TABS: 500.0000 mg | ORAL_TABLET | Freq: Two times a day (BID) | ORAL | 0 refills | Status: AC

## 2017-04-10 NOTE — Progress Notes (Signed)
Flagyl re-ordered

## 2017-04-10 NOTE — MAU Note (Signed)
Pt C/O lower abd pain since this morning, is intermittent.  Denies bleeding.  Pos HPT in February, hasn't had PNC.

## 2017-04-10 NOTE — Discharge Instructions (Signed)
Second Trimester of Pregnancy The second trimester is from week 13 through week 28, month 4 through 6. This is often the time in pregnancy that you feel your best. Often times, morning sickness has lessened or quit. You may have more energy, and you may get hungry more often. Your unborn baby (fetus) is growing rapidly. At the end of the sixth month, he or she is about 9 inches long and weighs about 1 pounds. You will likely feel the baby move (quickening) between 18 and 20 weeks of pregnancy. Follow these instructions at home:  Avoid all smoking, herbs, and alcohol. Avoid drugs not approved by your doctor.  Do not use any tobacco products, including cigarettes, chewing tobacco, and electronic cigarettes. If you need help quitting, ask your doctor. You may get counseling or other support to help you quit.  Only take medicine as told by your doctor. Some medicines are safe and some are not during pregnancy.  Exercise only as told by your doctor. Stop exercising if you start having cramps.  Eat regular, healthy meals.  Wear a good support bra if your breasts are tender.  Do not use hot tubs, steam rooms, or saunas.  Wear your seat belt when driving.  Avoid raw meat, uncooked cheese, and liter boxes and soil used by cats.  Take your prenatal vitamins.  Take 1500-2000 milligrams of calcium daily starting at the 20th week of pregnancy until you deliver your baby.  Try taking medicine that helps you poop (stool softener) as needed, and if your doctor approves. Eat more fiber by eating fresh fruit, vegetables, and whole grains. Drink enough fluids to keep your pee (urine) clear or pale yellow.  Take warm water baths (sitz baths) to soothe pain or discomfort caused by hemorrhoids. Use hemorrhoid cream if your doctor approves.  If you have puffy, bulging veins (varicose veins), wear support hose. Raise (elevate) your feet for 15 minutes, 3-4 times a day. Limit salt in your diet.  Avoid heavy  lifting, wear low heals, and sit up straight.  Rest with your legs raised if you have leg cramps or low back pain.  Visit your dentist if you have not gone during your pregnancy. Use a soft toothbrush to brush your teeth. Be gentle when you floss.  You can have sex (intercourse) unless your doctor tells you not to.  Go to your doctor visits. Get help if:  You feel dizzy.  You have mild cramps or pressure in your lower belly (abdomen).  You have a nagging pain in your belly area.  You continue to feel sick to your stomach (nauseous), throw up (vomit), or have watery poop (diarrhea).  You have bad smelling fluid coming from your vagina.  You have pain with peeing (urination). Get help right away if:  You have a fever.  You are leaking fluid from your vagina.  You have spotting or bleeding from your vagina.  You have severe belly cramping or pain.  You lose or gain weight rapidly.  You have trouble catching your breath and have chest pain.  You notice sudden or extreme puffiness (swelling) of your face, hands, ankles, feet, or legs.  You have not felt the baby move in over an hour.  You have severe headaches that do not go away with medicine.  You have vision changes. This information is not intended to replace advice given to you by your health care provider. Make sure you discuss any questions you have with your health care  provider. Document Released: 03/13/2010 Document Revised: 05/24/2016 Document Reviewed: 02/17/2013 Elsevier Interactive Patient Education  2017 [employer].  Prenatal Care Providers [facility]    [facility]  [phone]     Phone: [phone]          [facility]                      [facility]   [place]     Phone: [phone]  Phone: [phone]         [facility]     Phone: [phone]  Phone:  [phone]           [facility] [place]                hone: [phone]  Phone: [phone]         [facility] [address] Francoise Ceo      Phone: [phone]  Phone: [phone]         [facility] Dept.                Phone: [phone]  [facility]   [address] [place])          Phone: [phone] [facility]   Phone: [phone]

## 2017-04-10 NOTE — MAU Provider Note (Signed)
MAU HISTORY AND PHYSICAL  Chief Complaint:  Abdominal Pain   [name] is a 27 y.o.  [id number] with IUP at [redacted]w[redacted]d presenting for Abdominal Pain . Patient states she has been having irregular contractions that feel like menstrual cramping, will last a few hours then go away. She had some vaginal burning externally and had some yeast infection type symptoms which went away a few days ago.No vaginal bleeding, intact membranes, with active fetal movement.   Menstrual History: OB History    Gravida Para Term Preterm AB Living   SAB TAB Ectopic Multiple Live Births         0 2      Patient's last menstrual period was 12/19/2016.      Past Medical History:  Diagnosis Date  . Anemia   . Depression    Was on Zoloft  . Hx of wisdom tooth extraction 2011    Past Surgical History:  Procedure Laterality Date  . WISDOM TOOTH EXTRACTION  2011    Family History  Problem Relation Age of Onset  . Asthma Sister   . Asthma Brother     Social History  Substance Use Topics  . Smoking status: Never Smoker  . Smokeless tobacco: Never Used  . Alcohol use No     Comment: not since pregnant, but before that on weekends     No Known Allergies  Prescriptions Prior to Admission  Medication Sig Dispense Refill Last Dose  . carbamide peroxide (DEBROX) 6.5 % otic solution Place 5 drops into the right ear 2 (two) times daily. X 5 days 15 mL 1   . ferrous sulfate 325 (65 FE) MG tablet Take 325 mg by mouth daily.   More than a month at Unknown time  . ferrous sulfate 325 (65 FE) MG tablet Take 1 tablet (325 mg total) by mouth 3 (three) times daily with meals. 90 tablet 11 More than a month at Unknown time  . ferrous sulfate 325 (65 FE) MG tablet Take 1 tablet (325 mg total) by mouth 2 (two) times daily with a meal. 60 tablet 3 More than a month at Unknown time  . ibuprofen (ADVIL,MOTRIN) 600 MG tablet Take 1 tablet (600 mg total) by mouth every 6 (six) hours as needed for moderate  pain. 30 tablet 0   . neomycin-polymyxin-hydrocortisone (CORTISPORIN) 3.5-10000-1 otic suspension Place 4 drops into the right ear 3 (three) times daily. X 7 days 10 mL 0   . ondansetron (ZOFRAN) 4 MG tablet Take 1 tablet (4 mg total) by mouth every 6 (six) hours. 12 tablet 0 More than a month at Unknown time  . oxyCODONE-acetaminophen (PERCOCET/ROXICET) 5-325 MG per tablet Take 1 tablet by mouth every 6 (six) hours as needed (for pain scale 4-7). 30 tablet 0 More than a month at Unknown time  . predniSONE (DELTASONE) 50 MG tablet Take 1 pill daily for 5 days. 5 tablet 0   . Prenatal Vit-Fe Fumarate-FA (PRENATAL MULTIVITAMIN) TABS Take 1 tablet by mouth daily.    More than a month at Unknown time  . traMADol (ULTRAM) 50 MG tablet Take 1 tablet (50 mg total) by mouth every 6 (six) hours as needed. 15 tablet 0 More than a month at Unknown time    Review of Systems - Negative except for what is mentioned in HPI.  Physical Exam  Blood pressure 113/66, pulse 88, temperature 98.9 F (37.2 C), temperature source Oral, resp. rate  16, height  (1.575 m), weight 139 lb (63 kg), last menstrual period 12/19/2016, not currently breastfeeding. GENERAL: Well-developed, well-nourished female in no acute distress.  LUNGS: Clear to auscultation bilaterally.  HEART: Regular rate and rhythm. ABDOMEN: Soft, nontender, nondistended, gravid.  EXTREMITIES: Nontender, no edema, 2+ distal pulses. Cervical Exam: Dilatation 0cm   Effacement 0%   Station high   Presentation: undetermined   Labs: Results for orders placed or performed during the hospital encounter of 04/10/17 (from the past 24 hour(s))  Urinalysis, Routine w reflex microscopic   Collection Time: 04/10/17  6:57 PM  Result Value Ref Range   Color, Urine YELLOW YELLOW   APPearance CLEAR CLEAR   Specific Gravity, Urine 1.012 1.005 - 1.030   pH 7.0 5.0 - 8.0   Glucose, UA NEGATIVE NEGATIVE mg/dL   Hgb urine dipstick NEGATIVE NEGATIVE   Bilirubin  Urine NEGATIVE NEGATIVE   Ketones, ur NEGATIVE NEGATIVE mg/dL   Protein, ur NEGATIVE NEGATIVE mg/dL   Nitrite NEGATIVE NEGATIVE   Leukocytes, UA NEGATIVE NEGATIVE   RBC / HPF 0-5 0 - 5 RBC/hpf   WBC, UA 0-5 0 - 5 WBC/hpf   Bacteria, UA NONE SEEN NONE SEEN   Squamous Epithelial / LPF 0-5 (A) NONE SEEN   Mucous PRESENT   Wet prep, genital   Collection Time: 04/10/17  7:47 PM  Result Value Ref Range   Yeast Wet Prep HPF POC NONE SEEN NONE SEEN   Trich, Wet Prep NONE SEEN NONE SEEN   Clue Cells Wet Prep HPF POC PRESENT (A) NONE SEEN   WBC, Wet Prep HPF POC FEW (A) NONE SEEN   Sperm NONE SEEN     Imaging Studies:  No results found.  Assessment: Janet Brownfield is  27 y.o. [id number] at [redacted]w[redacted]d presents with Abdominal Pain .  Plan: Wet prep showed clue cells Will send flagyl  PO BID x 7 days to pharmacy Gc/chlamydia ordered and pending UA negative for infection Discussed importance of establishing with OBGYN in next week and patient given list of local OBGYNs.  Durenda Hurt 4/11/20187:56 PM   OB FELLOW MAU DISCHARGE ATTESTATION  I have seen and examined this patient; I agree with above documentation in the resident's note.    Jen Mow, DO OB Fellow

## 2017-04-11 LAB — GC/CHLAMYDIA PROBE AMP (~~LOC~~) NOT AT ARMC
CHLAMYDIA, DNA PROBE: NEGATIVE
NEISSERIA GONORRHEA: NEGATIVE

## 2017-04-28 ENCOUNTER — Inpatient Hospital Stay (HOSPITAL_COMMUNITY)
Admission: AD | Admit: 2017-04-28 | Discharge: 2017-04-28 | Disposition: A | Discharge status: Home / Self Care | Payer: Medicaid Other | Source: Ambulatory Visit | Attending: Obstetrics & Gynecology | Admitting: Obstetrics & Gynecology

## 2017-04-28 ENCOUNTER — Encounter (HOSPITAL_COMMUNITY): Payer: Self-pay

## 2017-04-28 DIAGNOSIS — Z825 Family history of asthma and other chronic lower respiratory diseases: Secondary | ICD-10-CM | POA: Diagnosis not present

## 2017-04-28 DIAGNOSIS — O9989 Other specified diseases and conditions complicating pregnancy, childbirth and the puerperium: Secondary | ICD-10-CM

## 2017-04-28 DIAGNOSIS — Z331 Pregnant state, incidental: Secondary | ICD-10-CM | POA: Diagnosis not present

## 2017-04-28 DIAGNOSIS — A084 Viral intestinal infection, unspecified: Secondary | ICD-10-CM | POA: Diagnosis not present

## 2017-04-28 DIAGNOSIS — R112 Nausea with vomiting, unspecified: Secondary | ICD-10-CM | POA: Diagnosis present

## 2017-04-28 LAB — URINALYSIS, ROUTINE W REFLEX MICROSCOPIC
Bilirubin Urine: NEGATIVE
Glucose, UA: NEGATIVE mg/dL
Hgb urine dipstick: NEGATIVE
Ketones, ur: 20 mg/dL — AB
NITRITE: NEGATIVE
Protein, ur: NEGATIVE mg/dL
SPECIFIC GRAVITY, URINE: 1.023 (ref 1.005–1.030)
pH: 8 (ref 5.0–8.0)

## 2017-04-28 MED ORDER — PROMETHAZINE HCL 25 MG PO TABS: 25.0000 mg | ORAL_TABLET | Freq: Four times a day (QID) | ORAL | 2 refills | Status: DC | PRN

## 2017-04-28 MED ORDER — PROMETHAZINE HCL 25 MG/ML IJ SOLN
25.0000 mg | Freq: Once | INTRAVENOUS | Status: AC
  Administered 2017-04-28: 25 mg via INTRAVENOUS
  Filled 2017-04-28: qty 1

## 2017-04-28 NOTE — MAU Provider Note (Signed)
Chief Complaint: Emesis and Diarrhea   First Provider Initiated Contact with Patient 04/28/17 1240     SUBJECTIVE HPI: [name] is a 27 y.o. [id number] at [redacted]w[redacted]d who presents to [facility]missions reporting nausea, vomiting, diarrhea since this morning. Has vomited about 4-5 times and had diarrhea 3 times. Husband thinks he is getting sick also.Unable to keep down food or fluids.  Duration: 4 hours Modifying factors: Hasn't taken anything for the symptoms.  Associated signs and symptoms: Negative for fever, chills  Past Medical History:  Diagnosis Date  . Anemia   . Depression    Was on Zoloft  . Hx of wisdom tooth extraction 2011   OB History  Gravida Para Term Preterm AB Living  0 2  SAB TAB Ectopic Multiple Live Births  0 0 0 0 2    # Outcome Date GA Lbr Len/2nd Weight Sex Delivery Anes PTL Lv  3 Current           2 Term 09/11/15 [redacted]w[redacted]d 15:00 / 00:21 7 lb 1.2 oz (3.209 kg) F Vag-Spont EPI  LIV  1 Preterm 05/19/13 [redacted]w[redacted]d / 02:19 5 lb 11 oz (2.58 kg) F Vag-Spont EPI  LIV     Past Surgical History:  Procedure Laterality Date  . WISDOM TOOTH EXTRACTION  2011   Social History   Social History  . Marital status: Single    Spouse name: N/A  . Number of children: N/A  . Years of education: N/A   Occupational History  . Not on file.   Social History Main Topics  . Smoking status: Never Smoker  . Smokeless tobacco: Never Used  . Alcohol use No     Comment: not since pregnant, but before that on weekends  . Drug use: No  . Sexual activity: Yes    Birth control/ protection: None   Other Topics Concern  . Not on file   Social History Narrative  . No narrative on file   Family History  Problem Relation Age of Onset  . Asthma Sister   . Asthma Brother    No current facility-administered medications on file prior to encounter.    Current Outpatient Prescriptions on File Prior to Encounter  Medication Sig Dispense Refill  . acetaminophen (TYLENOL) 500  MG tablet Take 1,000 mg by mouth every 6 (six) hours as needed for mild pain.    . Prenatal Vit-Fe Fumarate-FA (PRENATAL MULTIVITAMIN) TABS Take 1 tablet by mouth daily.      No Known Allergies  I have reviewed patient's Past Medical Hx, Surgical Hx, Family Hx, Social Hx, medications and allergies.   Review of Systems  Constitutional: Positive for appetite change. Negative for chills and fever.  Gastrointestinal: Positive for diarrhea, nausea and vomiting. Negative for abdominal pain and blood in stool.  Genitourinary: Negative for dysuria and vaginal bleeding.    OBJECTIVE Patient Vitals for the past 24 hrs:  BP Temp Pulse Resp  04/28/17 1200 - 99.2 F (37.3 C) - -  04/28/17 1159 (!) 114/57 - 93 16   Constitutional: Well-developed, well-nourished female in no acute distress.  Head: Mucous members moist. Cardiovascular: normal rate Respiratory: normal rate and effort.  Neurologic: Alert and oriented x 4.  GU: Deferred  Fetal heart rate 160 by Doppler  LAB RESULTS Results for orders placed or performed during the hospital encounter of 04/28/17 (from the past 24 hour(s))  Urinalysis, Routine w reflex microscopic     Status: Abnormal   Collection  Time: 04/28/17 11:55 AM  Result Value Ref Range   Color, Urine YELLOW YELLOW   APPearance CLEAR CLEAR   Specific Gravity, Urine 1.023 1.005 - 1.030   pH 8.0 5.0 - 8.0   Glucose, UA NEGATIVE NEGATIVE mg/dL   Hgb urine dipstick NEGATIVE NEGATIVE   Bilirubin Urine NEGATIVE NEGATIVE   Ketones, ur 20 (A) NEGATIVE mg/dL   Protein, ur NEGATIVE NEGATIVE mg/dL   Nitrite NEGATIVE NEGATIVE   Leukocytes, UA TRACE (A) NEGATIVE   RBC / HPF 0-5 0 - 5 RBC/hpf   WBC, UA 6-30 0 - 5 WBC/hpf   Bacteria, UA RARE (A) NONE SEEN   Squamous Epithelial / LPF 0-5 (A) NONE SEEN   Mucous PRESENT     IMAGING No results found.  MAU COURSE Orders Placed This Encounter  Procedures  . Urinalysis, Routine w reflex microscopic   Meds ordered this  encounter  Medications  . promethazine (PHENERGAN) 25 mg in lactated ringers 1,000 mL infusion   Actively vomiting in MAU. IV Phenergan ordered.   No further nausea and vomiting in MAU. Tolerating fluids.  MDM - Nausea, vomiting, diarrhea from viral gastroenteritis. Symptoms controlled with Phenergan. No evidence emergent condition.  ASSESSMENT 1. Viral gastroenteritis   2. Pregnancy, incidental     PLAN Discharge home in stable condition. Gastroenteritis and second trimester precautions. Clear liquids 24 hours then advance diet slowly. Encouraged frequent handwashing  Rx Phenergan Follow-up Information    Obstetrician of your choice Follow up.   Why:  Start prenatal care        MAU as needed in emergencies  Allergies as of 04/28/2017   No Known Allergies     Medication List    TAKE these medications   acetaminophen 500 MG tablet Commonly known as:  TYLENOL Take 1,000 mg by mouth every 6 (six) hours as needed for mild pain.   prenatal multivitamin Tabs tablet Take 1 tablet by mouth daily.   promethazine 25 MG tablet Commonly known as:  PHENERGAN Take 1 tablet (25 mg total) by mouth every 6 (six) hours as needed for nausea or vomiting.           [place], [place] 04/28/2017  12:41 PM

## 2017-04-28 NOTE — Discharge Instructions (Signed)
Viral Gastroenteritis, Adult Viral gastroenteritis is also known as the stomach flu. This condition is caused by various viruses. These viruses can be passed from person to person very easily (are very contagious). This condition may affect your stomach, small intestine, and large intestine. It can cause sudden watery diarrhea, fever, and vomiting. Diarrhea and vomiting can make you feel weak and cause you to become dehydrated. You may not be able to keep fluids down. Dehydration can make you tired and thirsty, cause you to have a dry mouth, and decrease how often you urinate. Older adults and people with other diseases or a weak immune system are at higher risk for dehydration. It is important to replace the fluids that you lose from diarrhea and vomiting. If you become severely dehydrated, you may need to get fluids through an IV tube. What are the causes? Gastroenteritis is caused by various viruses, including rotavirus and norovirus. Norovirus is the most common cause in adults. You can get sick by eating food, drinking water, or touching a surface contaminated with one of these viruses. You can also get sick from sharing utensils or other personal items with an infected person. What increases the risk? This condition is more likely to develop in people:  Who have a weak defense system (immune system).  Who live with one or more children who are younger than 28 years old.  Who live in a nursing home.  Who go on cruise ships. What are the signs or symptoms? Symptoms of this condition start suddenly 1-2 days after exposure to a virus. Symptoms may last a few days or as long as a week. The most common symptoms are watery diarrhea and vomiting. Other symptoms include:  Fever.  Headache.  Fatigue.  Pain in the abdomen.  Chills.  Weakness.  Nausea.  Muscle aches.  Loss of appetite. How is this diagnosed? This condition is diagnosed with a medical history and physical exam. You may  also have a stool test to check for viruses or other infections. How is this treated? This condition typically goes away on its own. The focus of treatment is to restore lost fluids (rehydration). Your health care provider may recommend that you take an oral rehydration solution (ORS) to replace important salts and minerals (electrolytes) in your body. Severe cases of this condition may require giving fluids through an IV tube. Treatment may also include medicine to help with your symptoms. Follow these instructions at home: Follow instructions from your health care provider about how to care for yourself at home. Eating and drinking  Follow these recommendations as told by your health care provider:  Take an ORS. This is a drink that is sold at pharmacies and retail stores.  Drink clear fluids in small amounts as you are able. Clear fluids include water, ice chips, diluted fruit juice, and low-calorie sports drinks.  Eat bland, easy-to-digest foods in small amounts as you are able. These foods include bananas, applesauce, rice, lean meats, toast, and crackers.  Avoid fluids that contain a lot of sugar or caffeine, such as energy drinks, sports drinks, and soda.  Avoid alcohol.  Avoid spicy or fatty foods. General instructions    Drink enough fluid to keep your urine clear or pale yellow.  Wash your hands often. If soap and water are not available, use hand sanitizer.  Make sure that all people in your household wash their hands well and often.  Take over-the-counter and prescription medicines only as told by your health care  provider.  Rest at home while you recover.  Watch your condition for any changes.  Take a warm bath to relieve any burning or pain from frequent diarrhea episodes.  Keep all follow-up visits as told by your health care provider. This is important. Contact a health care provider if:  You cannot keep fluids down.  Your symptoms get worse.  You have new  symptoms.  You feel light-headed or dizzy.  You have muscle cramps. Get help right away if:  You have chest pain.  You feel extremely weak or you faint.  You see blood in your vomit.  Your vomit looks like coffee grounds.  You have bloody or black stools or stools that look like tar.  You have a severe headache, a stiff neck, or both.  You have a rash.  You have severe pain, cramping, or bloating in your abdomen.  You have trouble breathing or you are breathing very quickly.  Your heart is beating very quickly.  Your skin feels cold and clammy.  You feel confused.  You have pain when you urinate.  You have signs of dehydration, such as:  Dark urine, very little urine, or no urine.  Cracked lips.  Dry mouth.  Sunken eyes.  Sleepiness.  Weakness. This information is not intended to replace advice given to you by your health care provider. Make sure you discuss any questions you have with your health care provider. Document Released: 12/17/2005 Document Revised: 05/30/2016 Document Reviewed: 08/23/2015 Elsevier Interactive Patient Education  2017 Elsevier Inc.   Prenatal Care WHAT IS PRENATAL CARE? Prenatal care is the process of caring for a pregnant woman before she gives birth. Prenatal care makes sure that she and her baby remain as healthy as possible throughout pregnancy. Prenatal care may be provided by a midwife, family practice health care provider, or a childbirth and pregnancy specialist (obstetrician). Prenatal care may include physical examinations, testing, treatments, and education on nutrition, lifestyle, and social support services. WHY IS PRENATAL CARE SO IMPORTANT? Early and consistent prenatal care increases the chance that you and your baby will remain healthy throughout your pregnancy. This type of care also decreases a babys risk of being born too early (prematurely), or being born smaller than expected (small for gestational age). Any  underlying medical conditions you may have that could pose a risk during your pregnancy are discussed during prenatal care visits. You will also be monitored regularly for any new conditions that may arise during your pregnancy so they can be treated quickly and effectively. WHAT HAPPENS DURING PRENATAL CARE VISITS? Prenatal care visits may include the following: Discussion Tell your health care provider about any new signs or symptoms you have experienced since your last visit. These might include:  Nausea or vomiting.  Increased or decreased level of energy.  Difficulty sleeping.  Back or leg pain.  Weight changes.  Frequent urination.  Shortness of breath with physical activity.  Changes in your skin, such as the development of a rash or itchiness.  Vaginal discharge or bleeding.  Feelings of excitement or nervousness.  Changes in your babys movements. You may want to write down any questions or topics you want to discuss with your health care provider and bring them with you to your appointment. Examination During your first prenatal care visit, you will likely have a complete physical exam. Your health care provider will often examine your vagina, cervix, and the position of your uterus, as well as check your heart, lungs, and other  body systems. As your pregnancy progresses, your health care provider will measure the size of your uterus and your babys position inside your uterus. He or she may also examine you for early signs of labor. Your prenatal visits may also include checking your blood pressure and, after about 10-12 weeks of pregnancy, listening to your babys heartbeat. Testing Regular testing often includes:  Urinalysis. This checks your urine for glucose, protein, or signs of infection.  Blood count. This checks the levels of white and red blood cells in your body.  Tests for sexually transmitted infections (STIs). Testing for STIs at the beginning of pregnancy  is routinely done and is required in many states.  Antibody testing. You will be checked to see if you are immune to certain illnesses, such as rubella, that can affect a developing fetus.  Glucose screen. Around 24-28 weeks of pregnancy, your blood glucose level will be checked for signs of gestational diabetes. Follow-up tests may be recommended.  Group B strep. This is a bacteria that is commonly found inside a womans vagina. This test will inform your health care provider if you need an antibiotic to reduce the amount of this bacteria in your body prior to labor and childbirth.  Ultrasound. Many pregnant women undergo an ultrasound screening around 18-20 weeks of pregnancy to evaluate the health of the fetus and check for any developmental abnormalities.  HIV (human immunodeficiency virus) testing. Early in your pregnancy, you will be screened for HIV. If you are at high risk for HIV, this test may be repeated during your third trimester of pregnancy. You may be offered other testing based on your age, personal or family medical history, or other factors. HOW OFTEN SHOULD I PLAN TO SEE MY HEALTH CARE PROVIDER FOR PRENATAL CARE? Your prenatal care check-up schedule depends on any medical conditions you have before, or develop during, your pregnancy. If you do not have any underlying medical conditions, you will likely be seen for checkups:  Monthly, during the first 6 months of pregnancy.  Twice a month during months 7 and 8 of pregnancy.  Weekly starting in the 9th month of pregnancy and until delivery. If you develop signs of early labor or other concerning signs or symptoms, you may need to see your health care provider more often. Ask your health care provider what prenatal care schedule is best for you. WHAT CAN I DO TO KEEP MYSELF AND MY BABY AS HEALTHY AS POSSIBLE DURING MY PREGNANCY?  Take a prenatal vitamin containing 400 micrograms (0.4 mg) of folic acid every day. Your health  care provider may also ask you to take additional vitamins such as iodine, vitamin D, iron, copper, and zinc.  Take 1500-2000 mg of calcium daily starting at your 20th week of pregnancy until you deliver your baby.  Make sure you are up to date on your vaccinations. Unless directed otherwise by your health care provider:  You should receive a tetanus, diphtheria, and pertussis (Tdap) vaccination between the 27th and 36th week of your pregnancy, regardless of when your last Tdap immunization occurred. This helps protect your baby from whooping cough (pertussis) after he or she is born.  You should receive an annual inactivated influenza vaccine (IIV) to help protect you and your baby from influenza. This can be done at any point during your pregnancy.  Eat a well-rounded diet that includes:  Fresh fruits and vegetables.  Lean proteins.  Calcium-rich foods such as milk, yogurt, hard cheeses, and dark, leafy greens.  Whole grain breads.  Do noteat seafood high in mercury, including:  Swordfish.  Tilefish.  Shark.  King mackerel.  More than 6 oz tuna per week.  Do not eat:  Raw or undercooked meats or eggs.  Unpasteurized foods, such as soft cheeses (brie, blue, or feta), juices, and milks.  Lunch meats.  Hot dogs that have not been heated until they are steaming.  Drink enough water to keep your urine clear or pale yellow. For many women, this may be 10 or more 8 oz glasses of water each day. Keeping yourself hydrated helps deliver nutrients to your baby and may prevent the start of pre-term uterine contractions.  Do not use any tobacco products including cigarettes, chewing tobacco, or electronic cigarettes. If you need help quitting, ask your health care provider.  Do not drink beverages containing alcohol. No safe level of alcohol consumption during pregnancy has been determined.  Do not use any illegal drugs. These can harm your developing baby or cause a  miscarriage.  Ask your health care provider or pharmacist before taking any prescription or over-the-counter medicines, herbs, or supplements.  Limit your caffeine intake to no more than 200 mg per day.  Exercise. Unless told otherwise by your health care provider, try to get 30 minutes of moderate exercise most days of the week. Do not  do high-impact activities, contact sports, or activities with a high risk of falling, such as horseback riding or downhill skiing.  Get plenty of rest.  Avoid anything that raises your body temperature, such as hot tubs and saunas.  If you own a cat, do not empty its litter box. Bacteria contained in cat feces can cause an infection called toxoplasmosis. This can result in serious harm to the fetus.  Stay away from chemicals such as insecticides, lead, mercury, and cleaning or paint products that contain solvents.  Do not have any X-rays taken unless medically necessary.  Take a childbirth and breastfeeding preparation class. Ask your health care provider if you need a referral or recommendation. This information is not intended to replace advice given to you by your health care provider. Make sure you discuss any questions you have with your health care provider. Document Released: 12/20/2003 Document Revised: 05/21/2016 Document Reviewed: 03/03/2014 Elsevier Interactive Patient Education  2017 Elsevier Inc.  [place] [facility] Providers    [facility] [employer] [facility]       Phone: [phone]  [facility] [employer] at [place] Phone: [phone]  [facility] [employer] at [place]  Phone: [phone]  [facility] [employer] [facility] at [facility]  Phone: [phone]  [facility] [place]  Phone: [phone]  [facility]       Phone: [phone]  [facility]    Phone: [phone]   [facility]  [place])    Phone: [phone]  [facility]    Phone: [phone]  [facility]    Phone: [phone]  [facility]    Phone: [phone]  [facility]       Phone: [phone]   [facility]  Phone: [phone]  [facility]    Phone: [phone]  [facility]   Phone: [phone]  [facility]      Phone: [phone]  [facility]    Phone: [phone]

## 2017-04-28 NOTE — MAU Note (Signed)
Patient presents with vomiting and diarrhea onset this morning.

## 2017-05-20 ENCOUNTER — Ambulatory Visit (INDEPENDENT_AMBULATORY_CARE_PROVIDER_SITE_OTHER): Payer: Medicaid Other | Admitting: Medical

## 2017-05-20 ENCOUNTER — Encounter: Payer: Self-pay | Admitting: Medical

## 2017-05-20 VITALS — BP 105/57 | HR 89 | Wt 144.2 lb

## 2017-05-20 DIAGNOSIS — N898 Other specified noninflammatory disorders of vagina: Secondary | ICD-10-CM | POA: Diagnosis not present

## 2017-05-20 DIAGNOSIS — O09892 Supervision of other high risk pregnancies, second trimester: Secondary | ICD-10-CM

## 2017-05-20 DIAGNOSIS — O09213 Supervision of pregnancy with history of pre-term labor, third trimester: Secondary | ICD-10-CM

## 2017-05-20 DIAGNOSIS — O09212 Supervision of pregnancy with history of pre-term labor, second trimester: Secondary | ICD-10-CM

## 2017-05-20 DIAGNOSIS — O26899 Other specified pregnancy related conditions, unspecified trimester: Principal | ICD-10-CM

## 2017-05-20 DIAGNOSIS — O26892 Other specified pregnancy related conditions, second trimester: Secondary | ICD-10-CM

## 2017-05-20 DIAGNOSIS — Z3480 Encounter for supervision of other normal pregnancy, unspecified trimester: Secondary | ICD-10-CM | POA: Insufficient documentation

## 2017-05-20 DIAGNOSIS — O09893 Supervision of other high risk pregnancies, third trimester: Secondary | ICD-10-CM | POA: Insufficient documentation

## 2017-05-20 DIAGNOSIS — Z3482 Encounter for supervision of other normal pregnancy, second trimester: Secondary | ICD-10-CM

## 2017-05-20 DIAGNOSIS — Z113 Encounter for screening for infections with a predominantly sexual mode of transmission: Secondary | ICD-10-CM | POA: Diagnosis not present

## 2017-05-20 LAB — POCT URINALYSIS DIP (DEVICE)
Bilirubin Urine: NEGATIVE
Glucose, UA: NEGATIVE mg/dL
Hgb urine dipstick: NEGATIVE
KETONES UR: NEGATIVE mg/dL
Nitrite: NEGATIVE
PH: 7.5 (ref 5.0–8.0)
Protein, ur: NEGATIVE mg/dL
SPECIFIC GRAVITY, URINE: 1.02 (ref 1.005–1.030)
Urobilinogen, UA: 0.2 mg/dL (ref 0.0–1.0)

## 2017-05-20 NOTE — Progress Notes (Signed)
Here for first prenatal visit. Given prenatal education packets. Declines flu shot. Signed up for babyscripts app.

## 2017-05-20 NOTE — Patient Instructions (Signed)
Second Trimester of Pregnancy The second trimester is from week 13 through week 28, month 4 through 6. This is often the time in pregnancy that you feel your best. Often times, morning sickness has lessened or quit. You may have more energy, and you may get hungry more often. Your unborn baby (fetus) is growing rapidly. At the end of the sixth month, he or she is about 9 inches long and weighs about 1 pounds. You will likely feel the baby move (quickening) between 18 and 20 weeks of pregnancy. Follow these instructions at home:  Avoid all smoking, herbs, and alcohol. Avoid drugs not approved by your doctor.  Do not use any tobacco products, including cigarettes, chewing tobacco, and electronic cigarettes. If you need help quitting, ask your doctor. You may get counseling or other support to help you quit.  Only take medicine as told by your doctor. Some medicines are safe and some are not during pregnancy.  Exercise only as told by your doctor. Stop exercising if you start having cramps.  Eat regular, healthy meals.  Wear a good support bra if your breasts are tender.  Do not use hot tubs, steam rooms, or saunas.  Wear your seat belt when driving.  Avoid raw meat, uncooked cheese, and liter boxes and soil used by cats.  Take your prenatal vitamins.  Take 1500-2000 milligrams of calcium daily starting at the 20th week of pregnancy until you deliver your baby.  Try taking medicine that helps you poop (stool softener) as needed, and if your doctor approves. Eat more fiber by eating fresh fruit, vegetables, and whole grains. Drink enough fluids to keep your pee (urine) clear or pale yellow.  Take warm water baths (sitz baths) to soothe pain or discomfort caused by hemorrhoids. Use hemorrhoid cream if your doctor approves.  If you have puffy, bulging veins (varicose veins), wear support hose. Raise (elevate) your feet for 15 minutes, 3-4 times a day. Limit salt in your diet.  Avoid heavy  lifting, wear low heals, and sit up straight.  Rest with your legs raised if you have leg cramps or low back pain.  Visit your dentist if you have not gone during your pregnancy. Use a soft toothbrush to brush your teeth. Be gentle when you floss.  You can have sex (intercourse) unless your doctor tells you not to.  Go to your doctor visits. Get help if:  You feel dizzy.  You have mild cramps or pressure in your lower belly (abdomen).  You have a nagging pain in your belly area.  You continue to feel sick to your stomach (nauseous), throw up (vomit), or have watery poop (diarrhea).  You have bad smelling fluid coming from your vagina.  You have pain with peeing (urination). Get help right away if:  You have a fever.  You are leaking fluid from your vagina.  You have spotting or bleeding from your vagina.  You have severe belly cramping or pain.  You lose or gain weight rapidly.  You have trouble catching your breath and have chest pain.  You notice sudden or extreme puffiness (swelling) of your face, hands, ankles, feet, or legs.  You have not felt the baby move in over an hour.  You have severe headaches that do not go away with medicine.  You have vision changes. This information is not intended to replace advice given to you by your health care provider. Make sure you discuss any questions you have with your health care   provider. Document Released: 03/13/2010 Document Revised: 05/24/2016 Document Reviewed: 02/17/2013 Elsevier Interactive Patient Education  2017 Elsevier Inc.  

## 2017-05-20 NOTE — Progress Notes (Signed)
   PRENATAL VISIT NOTE  Subjective:  [name] is a 27 y.o. [id number] at 1733w5d being seen today for ongoing prenatal care.  She is currently monitored for the following issues for this low-risk pregnancy and has Anemia, iron deficiency; Supervision of other normal pregnancy, antepartum; and History of preterm delivery, currently pregnant in second trimester on her problem list.  Patient reports no complaints.  Contractions: Not present. Vag. Bleeding: None.  Movement: Present. Denies leaking of fluid.   The following portions of the patient's history were reviewed and updated as appropriate: allergies, current medications, past family history, past medical history, past social history, past surgical history and problem list. Problem list updated.  Objective:   Vitals:   05/20/17 1255  BP: (!) 105/57  Pulse: 89  Weight: 144 lb 3.2 oz (65.4 kg)    Fetal Status: Fetal Heart Rate (bpm): 128 Fundal Height: 21 cm Movement: Present     General:  Alert, oriented and cooperative. Patient is in no acute distress.  Skin: Skin is warm and dry. No rash noted.   Cardiovascular: Normal heart rate noted, normal rhythm   Respiratory: Normal respiratory effort, no problems with respiration noted, normal breath sounds in all lung fields  Abdomen: Soft, gravid, appropriate for gestational age. BS present Pain/Pressure: Absent     Pelvic:  Cervical exam performed Dilation: Closed Effacement (%): Thick Station: Ballotable Small amount of frothy white-yellow discharge noted. Wet prep obtained.   Extremities: Normal range of motion.  Edema: None  Mental Status: Normal mood and affect. Normal behavior. Normal judgment and thought content.   Assessment and Plan:  Pregnancy: G3P1102 at 4333w5d  1. Supervision of other normal pregnancy, antepartum - Prenatal Profile I - Hemoglobinopathy Evaluation - Culture, OB Urine - US MFM OB COMP + 14 WK; Schedule  2. History of preterm delivery, currently pregnant  in second trimester - First child born between 6936-37 weeks and second child was full-term  Preterm labor symptoms and general obstetric precautions including but not limited to vaginal bleeding, contractions, leaking of fluid and fetal movement were reviewed in detail with the patient. Please refer to After Visit Summary for other counseling recommendations.  Return in about 4 weeks (around 06/17/2017) for LOB.   Vonzella NippleJulie Kisa Fujii, PA-C

## 2017-05-21 ENCOUNTER — Other Ambulatory Visit: Payer: Self-pay | Admitting: Medical

## 2017-05-21 DIAGNOSIS — B9689 Other specified bacterial agents as the cause of diseases classified elsewhere: Secondary | ICD-10-CM

## 2017-05-21 DIAGNOSIS — D649 Anemia, unspecified: Secondary | ICD-10-CM

## 2017-05-21 DIAGNOSIS — N760 Acute vaginitis: Secondary | ICD-10-CM

## 2017-05-21 DIAGNOSIS — A599 Trichomoniasis, unspecified: Secondary | ICD-10-CM

## 2017-05-21 DIAGNOSIS — O99012 Anemia complicating pregnancy, second trimester: Secondary | ICD-10-CM

## 2017-05-21 LAB — PRENATAL PROFILE I(LABCORP)
ANTIBODY SCREEN: NEGATIVE
BASOS: 0 %
Basophils Absolute: 0 10*3/uL (ref 0.0–0.2)
EOS (ABSOLUTE): 0 10*3/uL (ref 0.0–0.4)
Eos: 1 %
HEMATOCRIT: 28.7 % — AB (ref 34.0–46.6)
HEMOGLOBIN: 9.4 g/dL — AB (ref 11.1–15.9)
HEP B S AG: NEGATIVE
IMMATURE GRANS (ABS): 0 10*3/uL (ref 0.0–0.1)
Immature Granulocytes: 0 %
LYMPHS ABS: 1.8 10*3/uL (ref 0.7–3.1)
Lymphs: 27 %
MCH: 27.4 pg (ref 26.6–33.0)
MCHC: 32.8 g/dL (ref 31.5–35.7)
MCV: 84 fL (ref 79–97)
MONOS ABS: 0.4 10*3/uL (ref 0.1–0.9)
Monocytes: 5 %
NEUTROS PCT: 67 %
Neutrophils Absolute: 4.4 10*3/uL (ref 1.4–7.0)
Platelets: 207 10*3/uL (ref 150–379)
RBC: 3.43 x10E6/uL — AB (ref 3.77–5.28)
RDW: 14.9 % (ref 12.3–15.4)
RPR Ser Ql: NONREACTIVE
Rh Factor: POSITIVE
Rubella Antibodies, IGG: 2.49 index (ref >0.99)
WBC: 6.7 10*3/uL (ref 3.4–10.8)

## 2017-05-21 LAB — HEMOGLOBINOPATHY EVALUATION
FERRITIN: 33 ng/mL (ref 15–150)
HGB A2 QUANT: 2.4 % (ref 1.8–3.2)
HGB SOLUBILITY: NEGATIVE
HGB VARIANT: 0 %
Hgb A: 97.6 % (ref 96.4–98.8)
Hgb C: 0 %
Hgb F Quant: 0 % (ref 0.0–2.0)
Hgb S: 0 %

## 2017-05-21 LAB — CERVICOVAGINAL ANCILLARY ONLY
Bacterial vaginitis: POSITIVE — AB
CHLAMYDIA, DNA PROBE: NEGATIVE
Candida vaginitis: NEGATIVE
NEISSERIA GONORRHEA: NEGATIVE
Trichomonas: POSITIVE — AB

## 2017-05-21 MED ORDER — METRONIDAZOLE 500 MG PO TABS: 500.0000 mg | ORAL_TABLET | Freq: Two times a day (BID) | ORAL | 0 refills | Status: DC

## 2017-05-21 MED ORDER — FERROUS SULFATE 325 (65 FE) MG PO TABS: 325.0000 mg | ORAL_TABLET | Freq: Every day | ORAL | 3 refills | Status: DC

## 2017-05-22 ENCOUNTER — Encounter: Payer: Self-pay | Admitting: General Practice

## 2017-05-22 ENCOUNTER — Ambulatory Visit (HOSPITAL_COMMUNITY)
Admission: RE | Admit: 2017-05-22 | Discharge: 2017-05-22 | Disposition: A | Discharge status: Home / Self Care | Payer: Medicaid Other | Source: Ambulatory Visit | Attending: Medical | Admitting: Medical

## 2017-05-22 DIAGNOSIS — Z3A22 22 weeks gestation of pregnancy: Secondary | ICD-10-CM | POA: Diagnosis not present

## 2017-05-22 DIAGNOSIS — Z3689 Encounter for other specified antenatal screening: Secondary | ICD-10-CM | POA: Diagnosis not present

## 2017-05-22 DIAGNOSIS — O09219 Supervision of pregnancy with history of pre-term labor, unspecified trimester: Secondary | ICD-10-CM | POA: Insufficient documentation

## 2017-05-22 DIAGNOSIS — Z3480 Encounter for supervision of other normal pregnancy, unspecified trimester: Secondary | ICD-10-CM

## 2017-05-22 LAB — CULTURE, OB URINE

## 2017-05-22 LAB — URINE CULTURE, OB REFLEX

## 2017-05-25 ENCOUNTER — Encounter: Payer: Self-pay | Admitting: Obstetrics and Gynecology

## 2017-05-28 ENCOUNTER — Other Ambulatory Visit: Payer: Self-pay | Admitting: Medical

## 2017-06-01 ENCOUNTER — Encounter: Payer: Self-pay | Admitting: Medical

## 2017-06-17 ENCOUNTER — Encounter: Payer: Medicaid Other | Admitting: Advanced Practice Midwife

## 2017-06-26 ENCOUNTER — Encounter: Payer: Medicaid Other | Admitting: Student

## 2017-07-10 ENCOUNTER — Encounter: Payer: Medicaid Other | Admitting: Advanced Practice Midwife

## 2017-07-18 ENCOUNTER — Encounter: Payer: Medicaid Other | Admitting: Advanced Practice Midwife

## 2017-07-18 IMAGING — US US MFM OB COMP +14 WKS
1 series · 13 of 28 positions shown · non-contrast
Comparison: none

OB/Gyn Clinic

1  MUTSEM AMIRAH             [id number]      [id number]     [id number]
Indications
22 weeks gestation of pregnancy
Encounter for fetal anatomic survey
Poor obstetric history: Previous preterm
delivery, antepartum
OB History
Gravidity:    3         Term:   1        Prem:   1        SAB:   0
TOP:          0       Ectopic:  0        Living: 2
Fetal Evaluation
Num Of Fetuses:     1
Fetal Heart         164
Rate(bpm):
Cardiac Activity:   Observed
Presentation:       Cephalic
Placenta:           Anterior, above cervical os
P. Cord Insertion:  Visualized
Amniotic Fluid
AFI FV:      Subjectively within normal limits
Largest Pocket(cm)
6.9
Biometry
BPD:      54.3  mm     G. Age:  22w 4d         68  %    CI:        76.08   %   70 - 86
FL/HC:      19.2   %   18.4 -
HC:      197.3  mm     G. Age:  22w 0d         35  %    HC/AC:      1.15       1.06 -
AC:      171.3  mm     G. Age:  22w 1d         46  %    FL/BPD:     69.8   %   71 - 87
FL:       37.9  mm     G. Age:  22w 1d         44  %    FL/AC:      22.1   %   20 - 24
HUM:      37.6  mm     G. Age:  23w 1d         79  %
CER:      23.7  mm     G. Age:  21w 6d         46  %
CM:        5.9  mm
Est. FW:     476  gm      1 lb 1 oz     49  %
Gestational Age
LMP:           22w 0d       Date:   12/19/16                 EDD:   09/25/17
U/S Today:     22w 2d                                        EDD:   09/23/17
Best:          22w 0d    Det. By:   LMP  (12/19/16)          EDD:   09/25/17
Anatomy
Cranium:               Appears normal         Aortic Arch:            Appears normal
Cavum:                 Appears normal         Ductal Arch:            Appears normal
Ventricles:            Appears normal         Diaphragm:              Appears normal
Choroid Plexus:        Appears normal         Stomach:                Appears normal, left
sided
Cerebellum:            Appears normal         Abdomen:                Appears normal
Posterior Fossa:       Appears normal         Abdominal Wall:         Appears nml (cord
insert, abd wall)
Nuchal Fold:           Not applicable (>20    Cord Vessels:           Appears normal (3
wks GA)                                        vessel cord)
Face:                  Appears normal         Kidneys:                Appear normal
(orbits and profile)
Lips:                  Appears normal         Bladder:                Appears normal
Thoracic:              Appears normal         Spine:                  Appears normal
Heart:                 Appears normal         Upper Extremities:      Appears normal
(4CH, axis, and
situs)
RVOT:                  Appears normal         Lower Extremities:      Appears normal
LVOT:                  Appears normal
Other:  Fetus appears to be a female. Heels and 5th digit visualized. Open
hands visualized. Nasal bone visualized.
Cervix Uterus Adnexa
Cervix
Length:           4.19  cm.
Normal appearance by transabdominal scan.
Uterus
No abnormality visualized.
Left Ovary
Within normal limits.
Right Ovary
Cul De Sac:   No free fluid seen.
Adnexa:       No abnormality visualized.
Impression
INDICATION: 26 yr old [id number] at 88w7d with history of 22w2d
delivery for fetal anatomic survey. Remote read.

[Series 1: us mfm ob comp +14 wks · 83 acquisitions, 13 frames shown]
[im 4/83]
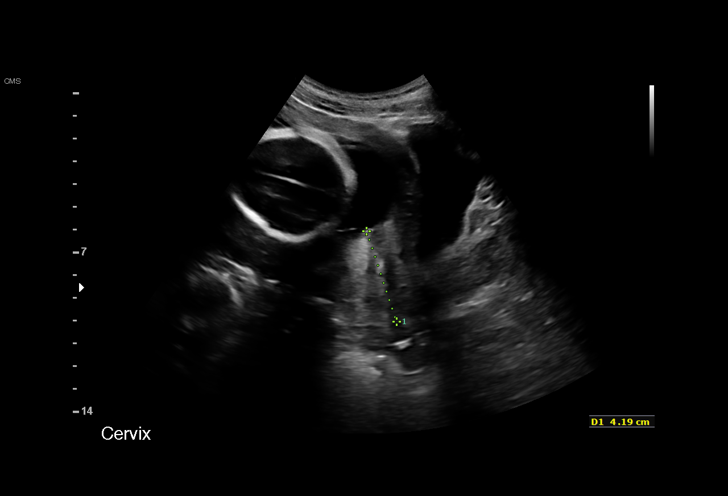
[im 10/83]
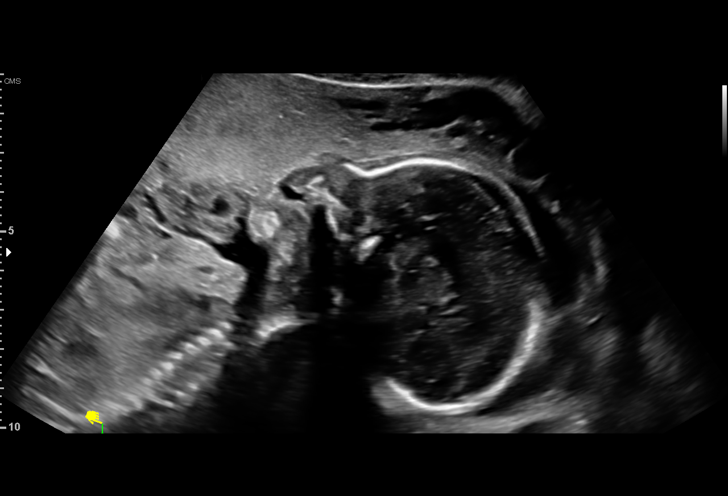
[im 16/83]
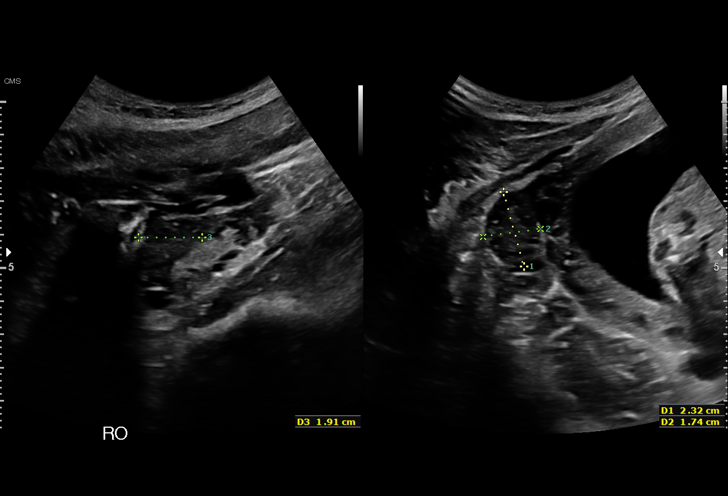
[im 22/83]
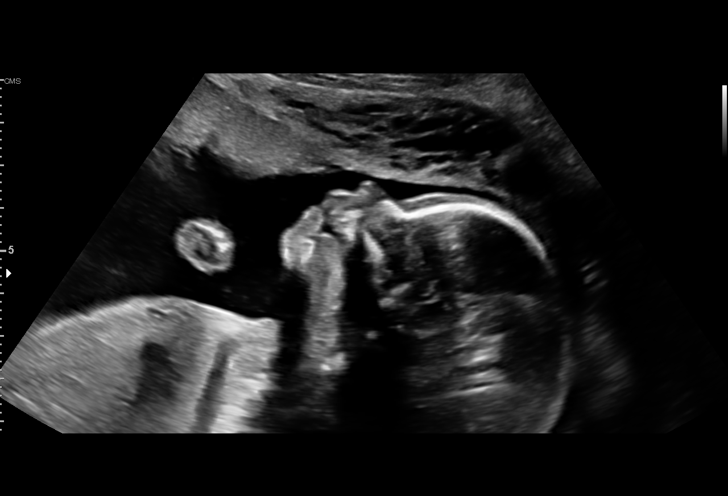
[im 28/83]
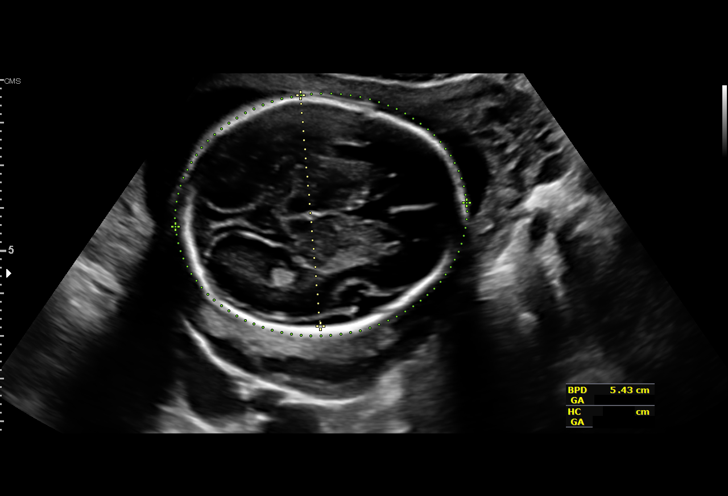
[im 34/83]
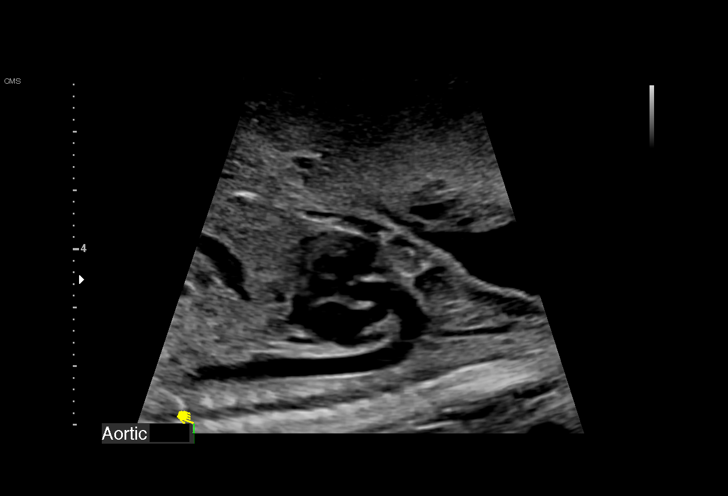
[im 43/83]
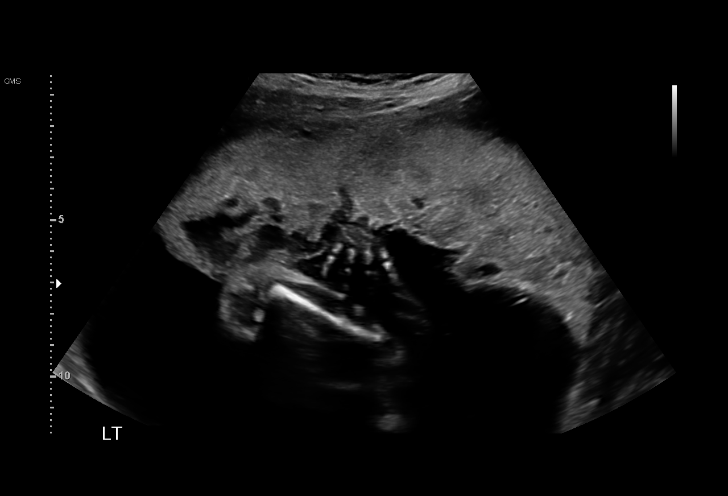
[im 49/83]
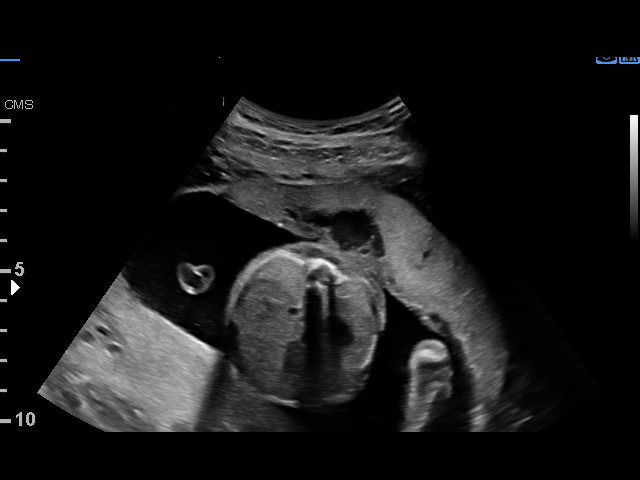
[im 55/83]
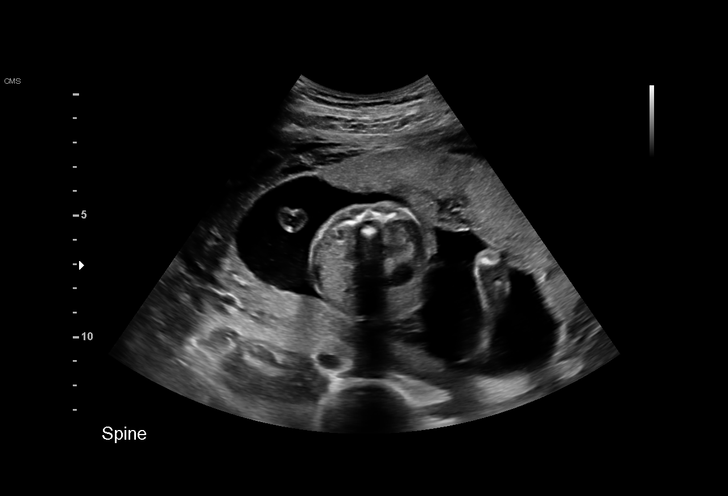
[im 61/83]
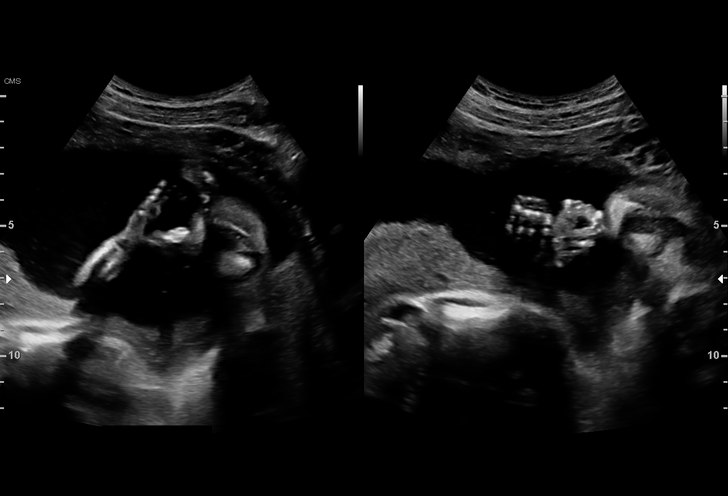
[im 67/83]
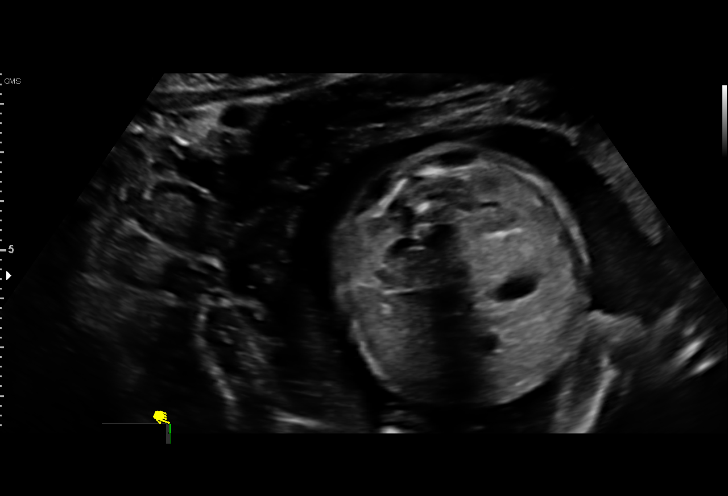
[im 73/83]
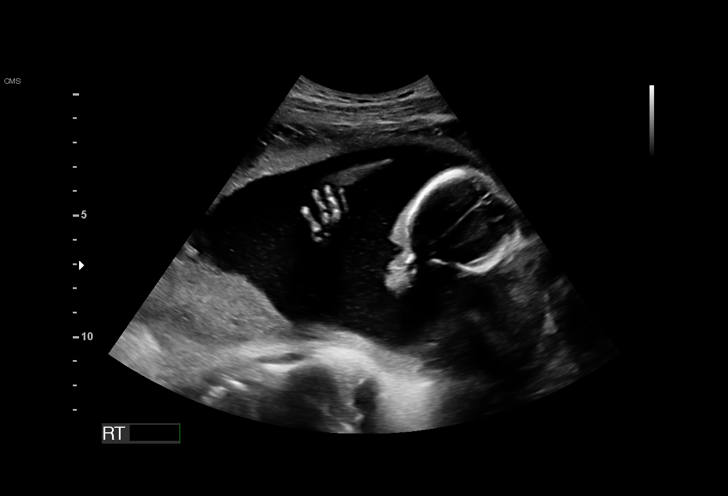
[im 79/83]
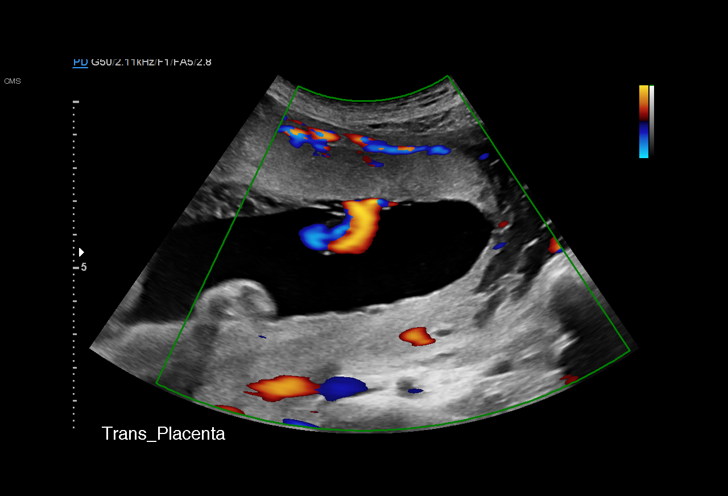

[13 of 28 positions shown; findings below may reference images not displayed]

FINDINGS: 1. Single intrauterine pregnancy.
2. Estimated fetal weight is in the 49th%.
3. Anterior placenta without evidence of previa.
4. Normal amniotic fluid volume.
5. Normal transabdominal cervical length.
6. Normal fetal anatomic survey.
Recommendations

1. Appropriate fetal growth.
2. Normal fetal anatomic survey.
3. Previous preterm delivery:
- delivered at 22w2d
- normal transabdominal cervical length on today's exam
- recommend preterm labor precautions
- had subsequent full term delivery
4. Recommend follow up ultrasounds as clinically indicated

## 2017-07-31 ENCOUNTER — Telehealth: Payer: Self-pay | Admitting: Family Medicine

## 2017-07-31 NOTE — Telephone Encounter (Signed)
Called [name] because she has not been seen since 05/21. She is now 4872w0d.

## 2017-08-01 ENCOUNTER — Encounter: Payer: Self-pay | Admitting: *Deleted

## 2017-08-02 ENCOUNTER — Encounter: Payer: Self-pay | Admitting: *Deleted

## 2017-08-02 LAB — HM PAP SMEAR: Pap: NEGATIVE

## 2017-08-08 ENCOUNTER — Ambulatory Visit (INDEPENDENT_AMBULATORY_CARE_PROVIDER_SITE_OTHER): Payer: Medicaid Other | Admitting: Obstetrics & Gynecology

## 2017-08-08 ENCOUNTER — Other Ambulatory Visit (HOSPITAL_COMMUNITY)
Admission: RE | Admit: 2017-08-08 | Discharge: 2017-08-08 | Disposition: A | Discharge status: Home / Self Care | Payer: Medicaid Other | Source: Ambulatory Visit | Attending: Obstetrics & Gynecology | Admitting: Obstetrics & Gynecology

## 2017-08-08 VITALS — BP 114/51 | HR 75 | Wt 157.5 lb

## 2017-08-08 DIAGNOSIS — O09892 Supervision of other high risk pregnancies, second trimester: Secondary | ICD-10-CM

## 2017-08-08 DIAGNOSIS — Z3483 Encounter for supervision of other normal pregnancy, third trimester: Secondary | ICD-10-CM | POA: Diagnosis not present

## 2017-08-08 DIAGNOSIS — Z3480 Encounter for supervision of other normal pregnancy, unspecified trimester: Secondary | ICD-10-CM

## 2017-08-08 DIAGNOSIS — Z23 Encounter for immunization: Secondary | ICD-10-CM

## 2017-08-08 DIAGNOSIS — Z113 Encounter for screening for infections with a predominantly sexual mode of transmission: Secondary | ICD-10-CM | POA: Diagnosis not present

## 2017-08-08 DIAGNOSIS — O0932 Supervision of pregnancy with insufficient antenatal care, second trimester: Secondary | ICD-10-CM | POA: Diagnosis not present

## 2017-08-08 DIAGNOSIS — O09212 Supervision of pregnancy with history of pre-term labor, second trimester: Secondary | ICD-10-CM | POA: Diagnosis not present

## 2017-08-08 MED ORDER — TETANUS-DIPHTH-ACELL PERTUSSIS 5-2.5-18.5 LF-MCG/0.5 IM SUSP
0.5000 mL | Freq: Once | INTRAMUSCULAR | Status: AC
  Administered 2017-08-08: 0.5 mL via INTRAMUSCULAR

## 2017-08-08 NOTE — Patient Instructions (Signed)
Third Trimester of Pregnancy The third trimester is from week 28 through week 40 (months 7 through 9). The third trimester is a time when the unborn baby (fetus) is growing rapidly. At the end of the ninth month, the fetus is about 20 inches in length and weighs 6-10 pounds. Body changes during your third trimester Your body will continue to go through many changes during pregnancy. The changes vary from woman to woman. During the third trimester:  Your weight will continue to increase. You can expect to gain 25-35 pounds (11-16 kg) by the end of the pregnancy.  You may begin to get stretch marks on your hips, abdomen, and breasts.  You may urinate more often because the fetus is moving lower into your pelvis and pressing on your bladder.  You may develop or continue to have heartburn. This is caused by increased hormones that slow down muscles in the digestive tract.  You may develop or continue to have constipation because increased hormones slow digestion and cause the muscles that push waste through your intestines to relax.  You may develop hemorrhoids. These are swollen veins (varicose veins) in the rectum that can itch or be painful.  You may develop swollen, bulging veins (varicose veins) in your legs.  You may have increased body aches in the pelvis, back, or thighs. This is due to weight gain and increased hormones that are relaxing your joints.  You may have changes in your hair. These can include thickening of your hair, rapid growth, and changes in texture. Some women also have hair loss during or after pregnancy, or hair that feels dry or thin. Your hair will most likely return to normal after your baby is born.  Your breasts will continue to grow and they will continue to become tender. A yellow fluid (colostrum) may leak from your breasts. This is the first milk you are producing for your baby.  Your belly button may stick out.  You may notice more swelling in your hands,  face, or ankles.  You may have increased tingling or numbness in your hands, arms, and legs. The skin on your belly may also feel numb.  You may feel short of breath because of your expanding uterus.  You may have more problems sleeping. This can be caused by the size of your belly, increased need to urinate, and an increase in your body's metabolism.  You may notice the fetus "dropping," or moving lower in your abdomen (lightening).  You may have increased vaginal discharge.  You may notice your joints feel loose and you may have pain around your pelvic bone.  What to expect at prenatal visits You will have prenatal exams every 2 weeks until week 36. Then you will have weekly prenatal exams. During a routine prenatal visit:  You will be weighed to make sure you and the baby are growing normally.  Your blood pressure will be taken.  Your abdomen will be measured to track your baby's growth.  The fetal heartbeat will be listened to.  Any test results from the previous visit will be discussed.  You may have a cervical check near your due date to see if your cervix has softened or thinned (effaced).  You will be tested for Group B streptococcus. This happens between 35 and 37 weeks.  Your health care provider may ask you:  What your birth plan is.  How you are feeling.  If you are feeling the baby move.  If you have had   any abnormal symptoms, such as leaking fluid, bleeding, severe headaches, or abdominal cramping.  If you are using any tobacco products, including cigarettes, chewing tobacco, and electronic cigarettes.  If you have any questions.  Other tests or screenings that may be performed during your third trimester include:  Blood tests that check for low iron levels (anemia).  Fetal testing to check the health, activity level, and growth of the fetus. Testing is done if you have certain medical conditions or if there are problems during the  pregnancy.  Nonstress test (NST). This test checks the health of your baby to make sure there are no signs of problems, such as the baby not getting enough oxygen. During this test, a belt is placed around your belly. The baby is made to move, and its heart rate is monitored during movement.  What is false labor? False labor is a condition in which you feel small, irregular tightenings of the muscles in the womb (contractions) that usually go away with rest, changing position, or drinking water. These are called Braxton Hicks contractions. Contractions may last for hours, days, or even weeks before true labor sets in. If contractions come at regular intervals, become more frequent, increase in intensity, or become painful, you should see your health care provider. What are the signs of labor?  Abdominal cramps.  Regular contractions that start at 10 minutes apart and become stronger and more frequent with time.  Contractions that start on the top of the uterus and spread down to the lower abdomen and back.  Increased pelvic pressure and dull back pain.  A watery or bloody mucus discharge that comes from the vagina.  Leaking of amniotic fluid. This is also known as your "water breaking." It could be a slow trickle or a gush. Let your health care provider know if it has a color or strange odor. If you have any of these signs, call your health care provider right away, even if it is before your due date. Follow these instructions at home: Medicines  Follow your health care provider's instructions regarding medicine use. Specific medicines may be either safe or unsafe to take during pregnancy.  Take a prenatal vitamin that contains at least 600 micrograms (mcg) of folic acid.  If you develop constipation, try taking a stool softener if your health care provider approves. Eating and drinking  Eat a balanced diet that includes fresh fruits and vegetables, whole grains, good sources of protein  such as meat, eggs, or tofu, and low-fat dairy. Your health care provider will help you determine the amount of weight gain that is right for you.  Avoid raw meat and uncooked cheese. These carry germs that can cause birth defects in the baby.  If you have low calcium intake from food, talk to your health care provider about whether you should take a daily calcium supplement.  Eat four or five small meals rather than three large meals a day.  Limit foods that are high in fat and processed sugars, such as fried and sweet foods.  To prevent constipation: ? Drink enough fluid to keep your urine clear or pale yellow. ? Eat foods that are high in fiber, such as fresh fruits and vegetables, whole grains, and beans. Activity  Exercise only as directed by your health care provider. Most women can continue their usual exercise routine during pregnancy. Try to exercise for 30 minutes at least 5 days a week. Stop exercising if you experience uterine contractions.  Avoid heavy   lifting.  Do not exercise in extreme heat or humidity, or at high altitudes.  Wear low-heel, comfortable shoes.  Practice good posture.  You may continue to have sex unless your health care provider tells you otherwise. Relieving pain and discomfort  Take frequent breaks and rest with your legs elevated if you have leg cramps or low back pain.  Take warm sitz baths to soothe any pain or discomfort caused by hemorrhoids. Use hemorrhoid cream if your health care provider approves.  Wear a good support bra to prevent discomfort from breast tenderness.  If you develop varicose veins: ? Wear support pantyhose or compression stockings as told by your healthcare provider. ? Elevate your feet for 15 minutes, 3-4 times a day. Prenatal care  Write down your questions. Take them to your prenatal visits.  Keep all your prenatal visits as told by your health care provider. This is important. Safety  Wear your seat belt at  all times when driving.  Make a list of emergency phone numbers, including numbers for family, friends, the hospital, and police and fire departments. General instructions  Avoid cat litter boxes and soil used by cats. These carry germs that can cause birth defects in the baby. If you have a cat, ask someone to clean the litter box for you.  Do not travel far distances unless it is absolutely necessary and only with the approval of your health care provider.  Do not use hot tubs, steam rooms, or saunas.  Do not drink alcohol.  Do not use any products that contain nicotine or tobacco, such as cigarettes and e-cigarettes. If you need help quitting, ask your health care provider.  Do not use any medicinal herbs or unprescribed drugs. These chemicals affect the formation and growth of the baby.  Do not douche or use tampons or scented sanitary pads.  Do not cross your legs for long periods of time.  To prepare for the arrival of your baby: ? Take prenatal classes to understand, practice, and ask questions about labor and delivery. ? Make a trial run to the hospital. ? Visit the hospital and tour the maternity area. ? Arrange for maternity or paternity leave through employers. ? Arrange for family and friends to take care of pets while you are in the hospital. ? Purchase a rear-facing car seat and make sure you know how to install it in your car. ? Pack your hospital bag. ? Prepare the baby's nursery. Make sure to remove all pillows and stuffed animals from the baby's crib to prevent suffocation.  Visit your dentist if you have not gone during your pregnancy. Use a soft toothbrush to brush your teeth and be gentle when you floss. Contact a health care provider if:  You are unsure if you are in labor or if your water has broken.  You become dizzy.  You have mild pelvic cramps, pelvic pressure, or nagging pain in your abdominal area.  You have lower back pain.  You have persistent  nausea, vomiting, or diarrhea.  You have an unusual or bad smelling vaginal discharge.  You have pain when you urinate. Get help right away if:  Your water breaks before 37 weeks.  You have regular contractions less than 5 minutes apart before 37 weeks.  You have a fever.  You are leaking fluid from your vagina.  You have spotting or bleeding from your vagina.  You have severe abdominal pain or cramping.  You have rapid weight loss or weight gain.    You have shortness of breath with chest pain.  You notice sudden or extreme swelling of your face, hands, ankles, feet, or legs.  Your baby makes fewer than 10 movements in 2 hours.  You have severe headaches that do not go away when you take medicine.  You have vision changes. Summary  The third trimester is from week 28 through week 40, months 7 through 9. The third trimester is a time when the unborn baby (fetus) is growing rapidly.  During the third trimester, your discomfort may increase as you and your baby continue to gain weight. You may have abdominal, leg, and back pain, sleeping problems, and an increased need to urinate.  During the third trimester your breasts will keep growing and they will continue to become tender. A yellow fluid (colostrum) may leak from your breasts. This is the first milk you are producing for your baby.  False labor is a condition in which you feel small, irregular tightenings of the muscles in the womb (contractions) that eventually go away. These are called Braxton Hicks contractions. Contractions may last for hours, days, or even weeks before true labor sets in.  Signs of labor can include: abdominal cramps; regular contractions that start at 10 minutes apart and become stronger and more frequent with time; watery or bloody mucus discharge that comes from the vagina; increased pelvic pressure and dull back pain; and leaking of amniotic fluid. This information is not intended to replace advice  given to you by your health care provider. Make sure you discuss any questions you have with your health care provider. Document Released: 12/11/2001 Document Revised: 05/24/2016 Document Reviewed: 02/17/2013 Elsevier Interactive Patient Education  2017 [employer]  

## 2017-08-08 NOTE — Progress Notes (Signed)
   PRENATAL VISIT NOTE  Subjective:  [name] is a 27 y.o. [id number] at 10248w1d being seen today for ongoing prenatal care.  She is currently monitored for the following issues for this low-risk pregnancy and has Late prenatal care affecting pregnancy in second trimester; Anemia, iron deficiency; Supervision of other normal pregnancy, antepartum; and History of preterm delivery, currently pregnant in second trimester on her problem list.  Patient reports no complaints.  Contractions: Irregular. Vag. Bleeding: None.  Movement: Present. Denies leaking of fluid.   The following portions of the patient's history were reviewed and updated as appropriate: allergies, current medications, past family history, past medical history, past social history, past surgical history and problem list. Problem list updated.  Objective:   Vitals:   08/08/17 1247  BP: (!) 114/51  Pulse: 75  Weight: 157 lb 8 oz (71.4 kg)    Fetal Status: Fetal Heart Rate (bpm): 150   Movement: Present     General:  Alert, oriented and cooperative. Patient is in no acute distress.  Skin: Skin is warm and dry. No rash noted.   Cardiovascular: Normal heart rate noted  Respiratory: Normal respiratory effort, no problems with respiration noted  Abdomen: Soft, gravid, appropriate for gestational age.  Pain/Pressure: Absent     Pelvic: Cervical exam deferred        Extremities: Normal range of motion.  Edema: Trace  Mental Status:  Normal mood and affect. Normal behavior. Normal judgment and thought content.   Assessment and Plan:  Pregnancy: G3P1102 at 6648w1d  1. Late prenatal care affecting pregnancy in second trimester  - HIV antibody - RPR - CBC - Glucose Tolerance, 1 Hour - Tdap (BOOSTRIX) injection 0.5 mL; Inject 0.5 mLs into the muscle once.  2. History of preterm delivery, currently pregnant in second trimester  - HIV antibody - RPR - CBC - Glucose Tolerance, 1 Hour - Tdap (BOOSTRIX) injection 0.5 mL;  Inject 0.5 mLs into the muscle once.  3. Supervision of other normal pregnancy, antepartum Pt is concerned about reexposure of Trich by partner. Last sexually active 1-2 weeks prev. She is worried because he was NOT treatment.   Preterm labor symptoms and general obstetric precautions including but not limited to vaginal bleeding, contractions, leaking of fluid and fetal movement were reviewed in detail with the patient. Please refer to After Visit Summary for other counseling recommendations.  Return in about 2 weeks (around 08/22/2017).   Willodean Rosenthalarolyn Harraway-Smith, MD

## 2017-08-09 LAB — CBC
Hematocrit: 26.5 % — ABNORMAL LOW (ref 34.0–46.6)
Hemoglobin: 8.9 g/dL — ABNORMAL LOW (ref 11.1–15.9)
MCH: 28 pg (ref 26.6–33.0)
MCHC: 33.6 g/dL (ref 31.5–35.7)
MCV: 83 fL (ref 79–97)
Platelets: 197 x10E3/uL (ref 150–379)
RBC: 3.18 x10E6/uL — ABNORMAL LOW (ref 3.77–5.28)
RDW: 13.8 % (ref 12.3–15.4)
WBC: 7.2 x10E3/uL (ref 3.4–10.8)

## 2017-08-09 LAB — GLUCOSE TOLERANCE, 1 HOUR: Glucose, 1Hr PP: 71 mg/dL (ref 65–199)

## 2017-08-09 LAB — HIV ANTIBODY (ROUTINE TESTING W REFLEX): HIV Screen 4th Generation wRfx: NONREACTIVE

## 2017-08-09 LAB — SYPHILIS: RPR W/REFLEX TO RPR TITER AND TREPONEMAL ANTIBODIES, TRADITIONAL SCREENING AND DIAGNOSIS ALGORITHM: RPR Ser Ql: NONREACTIVE

## 2017-08-12 ENCOUNTER — Telehealth: Payer: Self-pay

## 2017-08-12 LAB — CERVICOVAGINAL ANCILLARY ONLY: TRICH (WINDOWPATH): NEGATIVE

## 2017-08-12 NOTE — Telephone Encounter (Signed)
Called patient inform of lab results and need to start taking iron tid. I also advise patient to start taking miralax or colace to help with constipation.

## 2017-08-14 ENCOUNTER — Encounter: Payer: Self-pay | Admitting: Obstetrics & Gynecology

## 2017-08-29 ENCOUNTER — Encounter: Payer: Self-pay | Admitting: Obstetrics & Gynecology

## 2017-08-29 ENCOUNTER — Ambulatory Visit (INDEPENDENT_AMBULATORY_CARE_PROVIDER_SITE_OTHER): Payer: Medicaid Other | Admitting: Obstetrics & Gynecology

## 2017-08-29 ENCOUNTER — Other Ambulatory Visit (HOSPITAL_COMMUNITY)
Admission: RE | Admit: 2017-08-29 | Discharge: 2017-08-29 | Disposition: A | Discharge status: Home / Self Care | Payer: Medicaid Other | Source: Ambulatory Visit | Attending: Obstetrics & Gynecology | Admitting: Obstetrics & Gynecology

## 2017-08-29 VITALS — BP 111/77 | HR 93 | Wt 160.4 lb

## 2017-08-29 DIAGNOSIS — Z3483 Encounter for supervision of other normal pregnancy, third trimester: Secondary | ICD-10-CM

## 2017-08-29 DIAGNOSIS — Z113 Encounter for screening for infections with a predominantly sexual mode of transmission: Secondary | ICD-10-CM

## 2017-08-29 DIAGNOSIS — Z331 Pregnant state, incidental: Secondary | ICD-10-CM | POA: Diagnosis not present

## 2017-08-29 DIAGNOSIS — O09893 Supervision of other high risk pregnancies, third trimester: Secondary | ICD-10-CM

## 2017-08-29 DIAGNOSIS — Z3480 Encounter for supervision of other normal pregnancy, unspecified trimester: Secondary | ICD-10-CM | POA: Insufficient documentation

## 2017-08-29 DIAGNOSIS — O09213 Supervision of pregnancy with history of pre-term labor, third trimester: Secondary | ICD-10-CM

## 2017-08-29 LAB — OB RESULTS CONSOLE GBS: GBS: POSITIVE

## 2017-08-29 NOTE — Progress Notes (Signed)
   PRENATAL VISIT NOTE  Subjective:  [name] is a 27 y.o. [id number] at 5544w1d being seen today for ongoing prenatal care.  She is currently monitored for the following issues for this high-risk pregnancy and has Late prenatal care affecting pregnancy in second trimester; Anemia, iron deficiency; Supervision of other normal pregnancy, antepartum; and History of preterm delivery, currently pregnant, third trimester on her problem list.  Patient reports occasional contractions.  Contractions: Irregular. Vag. Bleeding: None.  Movement: Present. Denies leaking of fluid.   The following portions of the patient's history were reviewed and updated as appropriate: allergies, current medications, past family history, past medical history, past social history, past surgical history and problem list. Problem list updated.  Objective:   Vitals:   08/29/17 1429  BP: 111/77  Pulse: 93  Weight: 160 lb 6.4 oz (72.8 kg)    Fetal Status: Fetal Heart Rate (bpm): 143   Movement: Present     General:  Alert, oriented and cooperative. Patient is in no acute distress.  Skin: Skin is warm and dry. No rash noted.   Cardiovascular: Normal heart rate noted  Respiratory: Normal respiratory effort, no problems with respiration noted  Abdomen: Soft, gravid, appropriate for gestational age.  Pain/Pressure: Present     Pelvic: Cervical exam performed        Extremities: Normal range of motion.  Edema: None  Mental Status:  Normal mood and affect. Normal behavior. Normal judgment and thought content.   Assessment and Plan:  Pregnancy: G3P1102 at 2644w1d  1. Supervision of other normal pregnancy, antepartum Routine testing - Culture, beta strep (group b only) - [facility]lamydia [facility]  2. History of preterm delivery, currently pregnant, third trimester   Preterm labor symptoms and general obstetric precautions including but not limited to vaginal bleeding, contractions, leaking of  fluid and fetal movement were reviewed in detail with the patient. Please refer to After Visit Summary for other counseling recommendations.  Return in about 1 week (around 09/05/2017).   Scheryl DarterJames Colson Barco, MD

## 2017-08-29 NOTE — Patient Instructions (Signed)
Vaginal Delivery Vaginal delivery means that you will give birth by pushing your baby out of your birth canal (vagina). A team of health care providers will help you before, during, and after vaginal delivery. Birth experiences are unique for every woman and every pregnancy, and birth experiences vary depending on where you choose to give birth. What should I do to prepare for my baby's birth? Before your baby is born, it is important to talk with your health care provider about:  Your labor and delivery preferences. These may include: ? Medicines that you may be given. ? How you will manage your pain. This might include non-medical pain relief techniques or injectable pain relief such as epidural analgesia. ? How you and your baby will be monitored during labor and delivery. ? Who may be in the labor and delivery room with you. ? Your feelings about surgical delivery of your baby (cesarean delivery, or C-section) if this becomes necessary. ? Your feelings about receiving donated blood through an IV tube (blood transfusion) if this becomes necessary.  Whether you are able: ? To take pictures or videos of the birth. ? To eat during labor and delivery. ? To move around, walk, or change positions during labor and delivery.  What to expect after your baby is born, such as: ? Whether delayed umbilical cord clamping and cutting is offered. ? Who will care for your baby right after birth. ? Medicines or tests that may be recommended for your baby. ? Whether breastfeeding is supported in your hospital or birth center. ? How long you will be in the hospital or birth center.  How any medical conditions you have may affect your baby or your labor and delivery experience.  To prepare for your baby's birth, you should also:  Attend all of your health care visits before delivery (prenatal visits) as recommended by your health care provider. This is important.  Prepare your home for your baby's  arrival. Make sure that you have: ? Diapers. ? Baby clothing. ? Feeding equipment. ? Safe sleeping arrangements for you and your baby.  Install a car seat in your vehicle. Have your car seat checked by a certified car seat installer to make sure that it is installed safely.  Think about who will help you with your new baby at home for at least the first several weeks after delivery.  What can I expect when I arrive at the birth center or hospital? Once you are in labor and have been admitted into the hospital or birth center, your health care provider may:  Review your pregnancy history and any concerns you have.  Insert an IV tube into one of your veins. This is used to give you fluids and medicines.  Check your blood pressure, pulse, temperature, and heart rate (vital signs).  Check whether your bag of water (amniotic sac) has broken (ruptured).  Talk with you about your birth plan and discuss pain control options.  Monitoring Your health care provider may monitor your contractions (uterine monitoring) and your baby's heart rate (fetal monitoring). You may need to be monitored:  Often, but not continuously (intermittently).  All the time or for long periods at a time (continuously). Continuous monitoring may be needed if: ? You are taking certain medicines, such as medicine to relieve pain or make your contractions stronger. ? You have pregnancy or labor complications.  Monitoring may be done by:  Placing a special stethoscope or a handheld monitoring device on your abdomen to   check your baby's heartbeat, and feeling your abdomen for contractions. This method of monitoring does not continuously record your baby's heartbeat or your contractions.  Placing monitors on your abdomen (external monitors) to record your baby's heartbeat and the frequency and length of contractions. You may not have to wear external monitors all the time.  Placing monitors inside of your uterus  (internal monitors) to record your baby's heartbeat and the frequency, length, and strength of your contractions. ? Your health care provider may use internal monitors if he or she needs more information about the strength of your contractions or your baby's heart rate. ? Internal monitors are put in place by passing a thin, flexible wire through your vagina and into your uterus. Depending on the type of monitor, it may remain in your uterus or on your baby's head until birth. ? Your health care provider will discuss the benefits and risks of internal monitoring with you and will ask for your permission before inserting the monitors.  Telemetry. This is a type of continuous monitoring that can be done with external or internal monitors. Instead of having to stay in bed, you are able to move around during telemetry. Ask your health care provider if telemetry is an option for you.  Physical exam Your health care provider may perform a physical exam. This may include:  Checking whether your baby is positioned: ? With the head toward your vagina (head-down). This is most common. ? With the head toward the top of your uterus (head-up or breech). If your baby is in a breech position, your health care provider may try to turn your baby to a head-down position so you can deliver vaginally. If it does not seem that your baby can be born vaginally, your provider may recommend surgery to deliver your baby. In rare cases, you may be able to deliver vaginally if your baby is head-up (breech delivery). ? Lying sideways (transverse). Babies that are lying sideways cannot be delivered vaginally.  Checking your cervix to determine: ? Whether it is thinning out (effacing). ? Whether it is opening up (dilating). ? How low your baby has moved into your birth canal.  What are the three stages of labor and delivery?  Normal labor and delivery is divided into the following three stages: Stage 1  Stage 1 is the  longest stage of labor, and it can last for hours or days. Stage 1 includes: ? Early labor. This is when contractions may be irregular, or regular and mild. Generally, early labor contractions are more than 10 minutes apart. ? Active labor. This is when contractions get longer, more regular, more frequent, and more intense. ? The transition phase. This is when contractions happen very close together, are very intense, and may last longer than during any other part of labor.  Contractions generally feel mild, infrequent, and irregular at first. They get stronger, more frequent (about every 2-3 minutes), and more regular as you progress from early labor through active labor and transition.  Many women progress through stage 1 naturally, but you may need help to continue making progress. If this happens, your health care provider may talk with you about: ? Rupturing your amniotic sac if it has not ruptured yet. ? Giving you medicine to help make your contractions stronger and more frequent.  Stage 1 ends when your cervix is completely dilated to 4 inches (10 cm) and completely effaced. This happens at the end of the transition phase. Stage 2  Once   your cervix is completely effaced and dilated to 4 inches (10 cm), you may start to feel an urge to push. It is common for the body to naturally take a rest before feeling the urge to push, especially if you received an epidural or certain other pain medicines. This rest period may last for up to 1-2 hours, depending on your unique labor experience.  During stage 2, contractions are generally less painful, because pushing helps relieve contraction pain. Instead of contraction pain, you may feel stretching and burning pain, especially when the widest part of your baby's head passes through the vaginal opening (crowning).  Your health care provider will closely monitor your pushing progress and your baby's progress through the vagina during stage 2.  Your  health care provider may massage the area of skin between your vaginal opening and anus (perineum) or apply warm compresses to your perineum. This helps it stretch as the baby's head starts to crown, which can help prevent perineal tearing. ? In some cases, an incision may be made in your perineum (episiotomy) to allow the baby to pass through the vaginal opening. An episiotomy helps to make the opening of the vagina larger to allow more room for the baby to fit through.  It is very important to breathe and focus so your health care provider can control the delivery of your baby's head. Your health care provider may have you decrease the intensity of your pushing, to help prevent perineal tearing.  After delivery of your baby's head, the shoulders and the rest of the body generally deliver very quickly and without difficulty.  Once your baby is delivered, the umbilical cord may be cut right away, or this may be delayed for 1-2 minutes, depending on your baby's health. This may vary among health care providers, hospitals, and birth centers.  If you and your baby are healthy enough, your baby may be placed on your chest or abdomen to help maintain the baby's temperature and to help you bond with each other. Some mothers and babies start breastfeeding at this time. Your health care team will dry your baby and help keep your baby warm during this time.  Your baby may need immediate care if he or she: ? Showed signs of distress during labor. ? Has a medical condition. ? Was born too early (prematurely). ? Had a bowel movement before birth (meconium). ? Shows signs of difficulty transitioning from being inside the uterus to being outside of the uterus. If you are planning to breastfeed, your health care team will help you begin a feeding. Stage 3  The third stage of labor starts immediately after the birth of your baby and ends after you deliver the placenta. The placenta is an organ that develops  during pregnancy to provide oxygen and nutrients to your baby in the womb.  Delivering the placenta may require some pushing, and you may have mild contractions. Breastfeeding can stimulate contractions to help you deliver the placenta.  After the placenta is delivered, your uterus should tighten (contract) and become firm. This helps to stop bleeding in your uterus. To help your uterus contract and to control bleeding, your health care provider may: ? Give you medicine by injection, through an IV tube, by mouth, or through your rectum (rectally). ? Massage your abdomen or perform a vaginal exam to remove any blood clots that are left in your uterus. ? Empty your bladder by placing a thin, flexible tube (catheter) into your bladder. ? Encourage   you to breastfeed your baby. After labor is over, you and your baby will be monitored closely to ensure that you are both healthy until you are ready to go home. Your health care team will teach you how to care for yourself and your baby. This information is not intended to replace advice given to you by your health care provider. Make sure you discuss any questions you have with your health care provider. Document Released: 09/25/2008 Document Revised: 07/06/2016 Document Reviewed: 01/01/2016 Else[employer] Interactive Patient Education  2018 [employer]  

## 2017-08-30 LAB — GC/CHLAMYDIA PROBE AMP (~~LOC~~) NOT AT ARMC
CHLAMYDIA, DNA PROBE: NEGATIVE
Neisseria Gonorrhea: NEGATIVE

## 2017-09-01 LAB — CULTURE, BETA STREP (GROUP B ONLY): Strep Gp B Culture: POSITIVE — AB

## 2017-09-03 ENCOUNTER — Inpatient Hospital Stay (HOSPITAL_COMMUNITY)
Admission: AD | Admit: 2017-09-03 | Discharge: 2017-09-03 | Disposition: A | Discharge status: Home / Self Care | Payer: Medicaid Other | Source: Ambulatory Visit | Attending: Family Medicine | Admitting: Family Medicine

## 2017-09-03 ENCOUNTER — Encounter (HOSPITAL_COMMUNITY): Payer: Self-pay | Admitting: *Deleted

## 2017-09-03 DIAGNOSIS — Z3A36 36 weeks gestation of pregnancy: Secondary | ICD-10-CM | POA: Insufficient documentation

## 2017-09-03 DIAGNOSIS — O99013 Anemia complicating pregnancy, third trimester: Secondary | ICD-10-CM | POA: Diagnosis not present

## 2017-09-03 DIAGNOSIS — N898 Other specified noninflammatory disorders of vagina: Secondary | ICD-10-CM

## 2017-09-03 DIAGNOSIS — O26893 Other specified pregnancy related conditions, third trimester: Secondary | ICD-10-CM | POA: Diagnosis not present

## 2017-09-03 DIAGNOSIS — F329 Major depressive disorder, single episode, unspecified: Secondary | ICD-10-CM | POA: Diagnosis not present

## 2017-09-03 DIAGNOSIS — D509 Iron deficiency anemia, unspecified: Secondary | ICD-10-CM | POA: Diagnosis not present

## 2017-09-03 DIAGNOSIS — Z3480 Encounter for supervision of other normal pregnancy, unspecified trimester: Secondary | ICD-10-CM

## 2017-09-03 DIAGNOSIS — O99343 Other mental disorders complicating pregnancy, third trimester: Secondary | ICD-10-CM | POA: Diagnosis not present

## 2017-09-03 NOTE — Discharge Instructions (Signed)
Third Trimester of Pregnancy The third trimester is from week 28 through week 40 (months 7 through 9). The third trimester is a time when the unborn baby (fetus) is growing rapidly. At the end of the ninth month, the fetus is about 20 inches in length and weighs 6-10 pounds. Body changes during your third trimester Your body will continue to go through many changes during pregnancy. The changes vary from woman to woman. During the third trimester:  Your weight will continue to increase. You can expect to gain 25-35 pounds (11-16 kg) by the end of the pregnancy.  You may begin to get stretch marks on your hips, abdomen, and breasts.  You may urinate more often because the fetus is moving lower into your pelvis and pressing on your bladder.  You may develop or continue to have heartburn. This is caused by increased hormones that slow down muscles in the digestive tract.  You may develop or continue to have constipation because increased hormones slow digestion and cause the muscles that push waste through your intestines to relax.  You may develop hemorrhoids. These are swollen veins (varicose veins) in the rectum that can itch or be painful.  You may develop swollen, bulging veins (varicose veins) in your legs.  You may have increased body aches in the pelvis, back, or thighs. This is due to weight gain and increased hormones that are relaxing your joints.  You may have changes in your hair. These can include thickening of your hair, rapid growth, and changes in texture. Some women also have hair loss during or after pregnancy, or hair that feels dry or thin. Your hair will most likely return to normal after your baby is born.  Your breasts will continue to grow and they will continue to become tender. A yellow fluid (colostrum) may leak from your breasts. This is the first milk you are producing for your baby.  Your belly button may stick out.  You may notice more swelling in your hands,  face, or ankles.  You may have increased tingling or numbness in your hands, arms, and legs. The skin on your belly may also feel numb.  You may feel short of breath because of your expanding uterus.  You may have more problems sleeping. This can be caused by the size of your belly, increased need to urinate, and an increase in your body's metabolism.  You may notice the fetus "dropping," or moving lower in your abdomen (lightening).  You may have increased vaginal discharge.  You may notice your joints feel loose and you may have pain around your pelvic bone.  What to expect at prenatal visits You will have prenatal exams every 2 weeks until week 36. Then you will have weekly prenatal exams. During a routine prenatal visit:  You will be weighed to make sure you and the baby are growing normally.  Your blood pressure will be taken.  Your abdomen will be measured to track your baby's growth.  The fetal heartbeat will be listened to.  Any test results from the previous visit will be discussed.  You may have a cervical check near your due date to see if your cervix has softened or thinned (effaced).  You will be tested for Group B streptococcus. This happens between 35 and 37 weeks.  Your health care provider may ask you:  What your birth plan is.  How you are feeling.  If you are feeling the baby move.  If you have had   any abnormal symptoms, such as leaking fluid, bleeding, severe headaches, or abdominal cramping.  If you are using any tobacco products, including cigarettes, chewing tobacco, and electronic cigarettes.  If you have any questions.  Other tests or screenings that may be performed during your third trimester include:  Blood tests that check for low iron levels (anemia).  Fetal testing to check the health, activity level, and growth of the fetus. Testing is done if you have certain medical conditions or if there are problems during the  pregnancy.  Nonstress test (NST). This test checks the health of your baby to make sure there are no signs of problems, such as the baby not getting enough oxygen. During this test, a belt is placed around your belly. The baby is made to move, and its heart rate is monitored during movement.  What is false labor? False labor is a condition in which you feel small, irregular tightenings of the muscles in the womb (contractions) that usually go away with rest, changing position, or drinking water. These are called Braxton Hicks contractions. Contractions may last for hours, days, or even weeks before true labor sets in. If contractions come at regular intervals, become more frequent, increase in intensity, or become painful, you should see your health care provider. What are the signs of labor?  Abdominal cramps.  Regular contractions that start at 10 minutes apart and become stronger and more frequent with time.  Contractions that start on the top of the uterus and spread down to the lower abdomen and back.  Increased pelvic pressure and dull back pain.  A watery or bloody mucus discharge that comes from the vagina.  Leaking of amniotic fluid. This is also known as your "water breaking." It could be a slow trickle or a gush. Let your health care provider know if it has a color or strange odor. If you have any of these signs, call your health care provider right away, even if it is before your due date. Follow these instructions at home: Medicines  Follow your health care provider's instructions regarding medicine use. Specific medicines may be either safe or unsafe to take during pregnancy.  Take a prenatal vitamin that contains at least 600 micrograms (mcg) of folic acid.  If you develop constipation, try taking a stool softener if your health care provider approves. Eating and drinking  Eat a balanced diet that includes fresh fruits and vegetables, whole grains, good sources of protein  such as meat, eggs, or tofu, and low-fat dairy. Your health care provider will help you determine the amount of weight gain that is right for you.  Avoid raw meat and uncooked cheese. These carry germs that can cause birth defects in the baby.  If you have low calcium intake from food, talk to your health care provider about whether you should take a daily calcium supplement.  Eat four or five small meals rather than three large meals a day.  Limit foods that are high in fat and processed sugars, such as fried and sweet foods.  To prevent constipation: ? Drink enough fluid to keep your urine clear or pale yellow. ? Eat foods that are high in fiber, such as fresh fruits and vegetables, whole grains, and beans. Activity  Exercise only as directed by your health care provider. Most women can continue their usual exercise routine during pregnancy. Try to exercise for 30 minutes at least 5 days a week. Stop exercising if you experience uterine contractions.  Avoid heavy   lifting.  Do not exercise in extreme heat or humidity, or at high altitudes.  Wear low-heel, comfortable shoes.  Practice good posture.  You may continue to have sex unless your health care provider tells you otherwise. Relieving pain and discomfort  Take frequent breaks and rest with your legs elevated if you have leg cramps or low back pain.  Take warm sitz baths to soothe any pain or discomfort caused by hemorrhoids. Use hemorrhoid cream if your health care provider approves.  Wear a good support bra to prevent discomfort from breast tenderness.  If you develop varicose veins: ? Wear support pantyhose or compression stockings as told by your healthcare provider. ? Elevate your feet for 15 minutes, 3-4 times a day. Prenatal care  Write down your questions. Take them to your prenatal visits.  Keep all your prenatal visits as told by your health care provider. This is important. Safety  Wear your seat belt at  all times when driving.  Make a list of emergency phone numbers, including numbers for family, friends, the hospital, and police and fire departments. General instructions  Avoid cat litter boxes and soil used by cats. These carry germs that can cause birth defects in the baby. If you have a cat, ask someone to clean the litter box for you.  Do not travel far distances unless it is absolutely necessary and only with the approval of your health care provider.  Do not use hot tubs, steam rooms, or saunas.  Do not drink alcohol.  Do not use any products that contain nicotine or tobacco, such as cigarettes and e-cigarettes. If you need help quitting, ask your health care provider.  Do not use any medicinal herbs or unprescribed drugs. These chemicals affect the formation and growth of the baby.  Do not douche or use tampons or scented sanitary pads.  Do not cross your legs for long periods of time.  To prepare for the arrival of your baby: ? Take prenatal classes to understand, practice, and ask questions about labor and delivery. ? Make a trial run to the hospital. ? Visit the hospital and tour the maternity area. ? Arrange for maternity or paternity leave through employers. ? Arrange for family and friends to take care of pets while you are in the hospital. ? Purchase a rear-facing car seat and make sure you know how to install it in your car. ? Pack your hospital bag. ? Prepare the baby's nursery. Make sure to remove all pillows and stuffed animals from the baby's crib to prevent suffocation.  Visit your dentist if you have not gone during your pregnancy. Use a soft toothbrush to brush your teeth and be gentle when you floss. Contact a health care provider if:  You are unsure if you are in labor or if your water has broken.  You become dizzy.  You have mild pelvic cramps, pelvic pressure, or nagging pain in your abdominal area.  You have lower back pain.  You have persistent  nausea, vomiting, or diarrhea.  You have an unusual or bad smelling vaginal discharge.  You have pain when you urinate. Get help right away if:  Your water breaks before 37 weeks.  You have regular contractions less than 5 minutes apart before 37 weeks.  You have a fever.  You are leaking fluid from your vagina.  You have spotting or bleeding from your vagina.  You have severe abdominal pain or cramping.  You have rapid weight loss or weight gain.    You have shortness of breath with chest pain.  You notice sudden or extreme swelling of your face, hands, ankles, feet, or legs.  Your baby makes fewer than 10 movements in 2 hours.  You have severe headaches that do not go away when you take medicine.  You have vision changes. Summary  The third trimester is from week 28 through week 40, months 7 through 9. The third trimester is a time when the unborn baby (fetus) is growing rapidly.  During the third trimester, your discomfort may increase as you and your baby continue to gain weight. You may have abdominal, leg, and back pain, sleeping problems, and an increased need to urinate.  During the third trimester your breasts will keep growing and they will continue to become tender. A yellow fluid (colostrum) may leak from your breasts. This is the first milk you are producing for your baby.  False labor is a condition in which you feel small, irregular tightenings of the muscles in the womb (contractions) that eventually go away. These are called Braxton Hicks contractions. Contractions may last for hours, days, or even weeks before true labor sets in.  Signs of labor can include: abdominal cramps; regular contractions that start at 10 minutes apart and become stronger and more frequent with time; watery or bloody mucus discharge that comes from the vagina; increased pelvic pressure and dull back pain; and leaking of amniotic fluid. This information is not intended to replace advice  given to you by your health care provider. Make sure you discuss any questions you have with your health care provider. Document Released: 12/11/2001 Document Revised: 05/24/2016 Document Reviewed: 02/17/2013 [employer] Interactive Patient Education  2017 [employer]  

## 2017-09-03 NOTE — MAU Note (Signed)
Big blob of d/c 2 nights in row.  No bleeding. No leaking. No pain.

## 2017-09-03 NOTE — MAU Provider Note (Signed)
History     CSN: [id number]  Arrival date and time: 09/03/17 1706   First Provider Initiated Contact with Patient 09/03/17 1755      Chief Complaint  Patient presents with  . Vaginal Discharge   [name] is a 27 y.o. [id number] at [redacted]w[redacted]d presenting for evaluation of mucusy vaginal discharge. She describes it as looking like egg white with no blood streaking. She she first noticed it 2 days ago and it has occurred intermittently since. She denies any vulvovaginal irritation or itching. She denies any antecedent intercourse. She denies contractions, leakage of fluid, vaginal bleeding. She reports good fetal movement. Pregnancy complicated by iron deficiency anemia.     OB History  Gravida Para Term Preterm AB Living  3 2 1 1  0 2  SAB TAB Ectopic Multiple Live Births  0 0 0 0 2    # Outcome Date GA Lbr Len/2nd Weight Sex Delivery Anes PTL Lv  3 Current           2 Term 09/11/15 [redacted]w[redacted]d 15:00 / 00:21 7 lb 1.2 oz (3.209 kg) F Vag-Spont EPI  LIV  1 Preterm 05/19/13 [redacted]w[redacted]d / 02:19 5 lb 11 oz (2.58 kg) F Vag-Spont EPI  LIV       Past Medical History:  Diagnosis Date  . Anemia   . Depression    Was on Zoloft    Past Surgical History:  Procedure Laterality Date  . WISDOM TOOTH EXTRACTION  2011    Family History  Problem Relation Age of Onset  . Asthma Sister   . Asthma Brother     Social History  Substance Use Topics  . Smoking status: Never Smoker  . Smokeless tobacco: Never Used  . Alcohol use No     Comment: not since pregnant, but before that on weekends    Allergies: No Known Allergies  Prescriptions Prior to Admission  Medication Sig Dispense Refill Last Dose  . acetaminophen (TYLENOL) 500 MG tablet Take 1,000 mg by mouth every 6 (six) hours as needed for mild pain.   Taking  . ferrous sulfate 325 (65 FE) MG tablet Take 1 tablet (325 mg total) by mouth daily. 30 tablet 3 Taking  . Prenatal Vit-Fe Fumarate-FA (PRENATAL MULTIVITAMIN) TABS Take 1 tablet by  mouth daily.    Taking    Review of Systems  Constitutional: Negative for fatigue and fever.  HENT: Negative.   Respiratory: Negative.   Cardiovascular: Negative.   Gastrointestinal: Negative for abdominal pain, anal bleeding, constipation and diarrhea.  Genitourinary: Negative for dysuria and frequency.  Musculoskeletal: Negative for back pain.  Neurological: Negative for dizziness.   Physical Exam   Blood pressure 112/65, pulse 84, temperature 99.2 F (37.3 C), resp. rate 16, last menstrual period 12/19/2016, not currently breastfeeding.  Physical Exam  Nursing note and vitals reviewed. Constitutional: She is oriented to person, place, and time. She appears well-developed and well-nourished. No distress.  HENT:  Head: Normocephalic.  Eyes: No scleral icterus.  Neck: Neck supple.  Cardiovascular: Normal rate.   Respiratory: Effort normal.  GI: Soft. There is no tenderness.  Genitourinary: No vaginal discharge found.  Genitourinary Comments: Speculum exam: External genitalia significant for 2 mm condylomatous lesion at fourchette. Vagina pink, no lesions, physiologic scant white discharge, no mucus or blood seen, cervix clean  Neurological: She is alert and oriented to person, place, and time.  Skin: Skin is warm and dry.  Psychiatric: She has a normal mood and affect. Her behavior is normal.  MAU Course  Procedures Fetal monitoring Baseline fetal heart rate 150, moderate variability, accelerations present, no decelerations  Toco: Uterine irritability and occasional contraction   Surgeon with reassurance and labor precautions Assessment and Plan  27 yo G3P1102 at 5237w6d FWB by EFM 1. Vaginal discharge, gestational, third trimester   2. Supervision of other normal pregnancy, antepartum    Allergies as of 09/03/2017   No Known Allergies     Medication List    TAKE these medications   acetaminophen 500 MG tablet Commonly known as:  TYLENOL Take 1,000 mg by mouth  every 6 (six) hours as needed for mild pain.   ferrous sulfate 325 (65 FE) MG tablet Take 1 tablet (325 mg total) by mouth daily.   prenatal multivitamin Tabs tablet Take 1 tablet by mouth daily.      Follow-up Information    [facility].   Specialty:  Obstetrics and Gynecology Contact information: [address] [place] [id number] [phone]          Layani Foronda CNM 09/03/2017, 6:11 PM

## 2017-09-06 ENCOUNTER — Inpatient Hospital Stay (HOSPITAL_COMMUNITY): Payer: Medicaid Other | Admitting: Anesthesiology

## 2017-09-06 ENCOUNTER — Inpatient Hospital Stay (HOSPITAL_COMMUNITY)
Admission: AD | Admit: 2017-09-06 | Discharge: 2017-09-08 | DRG: 775 | Disposition: A | Discharge status: Home / Self Care | Payer: Medicaid Other | Source: Ambulatory Visit | Attending: Obstetrics and Gynecology | Admitting: Obstetrics and Gynecology

## 2017-09-06 ENCOUNTER — Encounter (HOSPITAL_COMMUNITY): Payer: Self-pay

## 2017-09-06 DIAGNOSIS — O9902 Anemia complicating childbirth: Secondary | ICD-10-CM | POA: Diagnosis present

## 2017-09-06 DIAGNOSIS — D509 Iron deficiency anemia, unspecified: Secondary | ICD-10-CM | POA: Diagnosis present

## 2017-09-06 DIAGNOSIS — Z880 Allergy status to penicillin: Secondary | ICD-10-CM

## 2017-09-06 DIAGNOSIS — O0932 Supervision of pregnancy with insufficient antenatal care, second trimester: Secondary | ICD-10-CM

## 2017-09-06 DIAGNOSIS — O99824 Streptococcus B carrier state complicating childbirth: Secondary | ICD-10-CM | POA: Diagnosis present

## 2017-09-06 DIAGNOSIS — Z3480 Encounter for supervision of other normal pregnancy, unspecified trimester: Secondary | ICD-10-CM

## 2017-09-06 DIAGNOSIS — Z3A37 37 weeks gestation of pregnancy: Secondary | ICD-10-CM

## 2017-09-06 DIAGNOSIS — Z3493 Encounter for supervision of normal pregnancy, unspecified, third trimester: Secondary | ICD-10-CM | POA: Diagnosis present

## 2017-09-06 LAB — CBC
HCT: 28.2 % — ABNORMAL LOW (ref 36.0–46.0)
Hemoglobin: 9.5 g/dL — ABNORMAL LOW (ref 12.0–15.0)
MCH: 27.9 pg (ref 26.0–34.0)
MCHC: 33.7 g/dL (ref 30.0–36.0)
MCV: 82.9 fL (ref 78.0–100.0)
PLATELETS: 195 10*3/uL (ref 150–400)
RBC: 3.4 MIL/uL — AB (ref 3.87–5.11)
RDW: 14.2 % (ref 11.5–15.5)
WBC: 9.4 10*3/uL (ref 4.0–10.5)

## 2017-09-06 LAB — TYPE AND SCREEN
ABO/RH(D): O POS
ANTIBODY SCREEN: NEGATIVE

## 2017-09-06 MED ORDER — EPHEDRINE 5 MG/ML INJ: 10.0000 mg | INTRAVENOUS | Status: DC | PRN

## 2017-09-06 MED ORDER — LACTATED RINGERS IV SOLN: 500.0000 mL | INTRAVENOUS | Status: DC | PRN

## 2017-09-06 MED ORDER — DIPHENHYDRAMINE HCL 50 MG/ML IJ SOLN
12.5000 mg | INTRAMUSCULAR | Status: DC | PRN
Start: 2017-09-06 — End: 2017-09-06

## 2017-09-06 MED ORDER — SENNOSIDES-DOCUSATE SODIUM 8.6-50 MG PO TABS
2.0000 | ORAL_TABLET | ORAL | Status: DC
  Administered 2017-09-06 – 2017-09-07 (×2): 2 via ORAL
  Filled 2017-09-06 (×2): qty 2

## 2017-09-06 MED ORDER — IBUPROFEN 600 MG PO TABS
600.0000 mg | ORAL_TABLET | Freq: Four times a day (QID) | ORAL | Status: DC
  Administered 2017-09-06 – 2017-09-08 (×7): 600 mg via ORAL
  Filled 2017-09-06 (×7): qty 1

## 2017-09-06 MED ORDER — TETANUS-DIPHTH-ACELL PERTUSSIS 5-2.5-18.5 LF-MCG/0.5 IM SUSP: 0.5000 mL | Freq: Once | INTRAMUSCULAR | Status: DC

## 2017-09-06 MED ORDER — PHENYLEPHRINE 40 MCG/ML (10ML) SYRINGE FOR IV PUSH (FOR BLOOD PRESSURE SUPPORT)
80.0000 ug | PREFILLED_SYRINGE | INTRAVENOUS | Status: DC | PRN
  Filled 2017-09-06: qty 10

## 2017-09-06 MED ORDER — PHENYLEPHRINE 40 MCG/ML (10ML) SYRINGE FOR IV PUSH (FOR BLOOD PRESSURE SUPPORT): 80.0000 ug | PREFILLED_SYRINGE | INTRAVENOUS | Status: DC | PRN

## 2017-09-06 MED ORDER — OXYCODONE-ACETAMINOPHEN 5-325 MG PO TABS: 1.0000 | ORAL_TABLET | ORAL | Status: DC | PRN

## 2017-09-06 MED ORDER — OXYTOCIN BOLUS FROM INFUSION
500.0000 mL | Freq: Once | INTRAVENOUS | Status: AC
  Administered 2017-09-06: 500 mL via INTRAVENOUS

## 2017-09-06 MED ORDER — BENZOCAINE-MENTHOL 20-0.5 % EX AERO
1.0000 "application " | INHALATION_SPRAY | CUTANEOUS | Status: DC | PRN
  Administered 2017-09-06: 1 via TOPICAL
  Filled 2017-09-06: qty 56

## 2017-09-06 MED ORDER — ONDANSETRON HCL 4 MG/2ML IJ SOLN: 4.0000 mg | INTRAMUSCULAR | Status: DC | PRN

## 2017-09-06 MED ORDER — AMPICILLIN SODIUM 2 G IJ SOLR
2.0000 g | Freq: Once | INTRAMUSCULAR | Status: AC
  Administered 2017-09-06: 2 g via INTRAVENOUS
  Filled 2017-09-06: qty 2000

## 2017-09-06 MED ORDER — LACTATED RINGERS IV SOLN: 500.0000 mL | Freq: Once | INTRAVENOUS | Status: DC

## 2017-09-06 MED ORDER — ZOLPIDEM TARTRATE 5 MG PO TABS: 5.0000 mg | ORAL_TABLET | Freq: Every evening | ORAL | Status: DC | PRN

## 2017-09-06 MED ORDER — DIPHENHYDRAMINE HCL 50 MG/ML IJ SOLN: 12.5000 mg | INTRAMUSCULAR | Status: DC | PRN

## 2017-09-06 MED ORDER — SOD CITRATE-CITRIC ACID 500-334 MG/5ML PO SOLN: 30.0000 mL | ORAL | Status: DC | PRN

## 2017-09-06 MED ORDER — OXYCODONE-ACETAMINOPHEN 5-325 MG PO TABS: 2.0000 | ORAL_TABLET | ORAL | Status: DC | PRN

## 2017-09-06 MED ORDER — COCONUT OIL OIL: 1.0000 "application " | TOPICAL_OIL | Status: DC | PRN

## 2017-09-06 MED ORDER — BUTORPHANOL TARTRATE 1 MG/ML IJ SOLN
1.0000 mg | Freq: Once | INTRAMUSCULAR | Status: AC
  Administered 2017-09-06: 1 mg via INTRAVENOUS
  Filled 2017-09-06: qty 1

## 2017-09-06 MED ORDER — PRENATAL MULTIVITAMIN CH
1.0000 | ORAL_TABLET | Freq: Every day | ORAL | Status: DC
  Administered 2017-09-07: 1 via ORAL
  Filled 2017-09-06: qty 1

## 2017-09-06 MED ORDER — ACETAMINOPHEN 325 MG PO TABS
650.0000 mg | ORAL_TABLET | ORAL | Status: DC | PRN
  Administered 2017-09-07 (×3): 650 mg via ORAL
  Filled 2017-09-06 (×3): qty 2

## 2017-09-06 MED ORDER — DIBUCAINE 1 % RE OINT: 1.0000 "application " | TOPICAL_OINTMENT | RECTAL | Status: DC | PRN

## 2017-09-06 MED ORDER — LACTATED RINGERS IV SOLN
500.0000 mL | Freq: Once | INTRAVENOUS | Status: DC
Start: 2017-09-06 — End: 2017-09-06

## 2017-09-06 MED ORDER — FLEET ENEMA 7-19 GM/118ML RE ENEM: 1.0000 | ENEMA | RECTAL | Status: DC | PRN

## 2017-09-06 MED ORDER — WITCH HAZEL-GLYCERIN EX PADS: 1.0000 "application " | MEDICATED_PAD | CUTANEOUS | Status: DC | PRN

## 2017-09-06 MED ORDER — LIDOCAINE HCL (PF) 1 % IJ SOLN
INTRAMUSCULAR | Status: DC | PRN
  Administered 2017-09-06 (×2): 6 mL via EPIDURAL

## 2017-09-06 MED ORDER — SIMETHICONE 80 MG PO CHEW: 80.0000 mg | CHEWABLE_TABLET | ORAL | Status: DC | PRN

## 2017-09-06 MED ORDER — LACTATED RINGERS IV SOLN
INTRAVENOUS | Status: DC
  Administered 2017-09-06: 125 mL/h via INTRAVENOUS

## 2017-09-06 MED ORDER — DIPHENHYDRAMINE HCL 25 MG PO CAPS: 25.0000 mg | ORAL_CAPSULE | Freq: Four times a day (QID) | ORAL | Status: DC | PRN

## 2017-09-06 MED ORDER — ACETAMINOPHEN 325 MG PO TABS: 650.0000 mg | ORAL_TABLET | ORAL | Status: DC | PRN

## 2017-09-06 MED ORDER — ONDANSETRON HCL 4 MG/2ML IJ SOLN: 4.0000 mg | Freq: Four times a day (QID) | INTRAMUSCULAR | Status: DC | PRN

## 2017-09-06 MED ORDER — LIDOCAINE HCL (PF) 1 % IJ SOLN
30.0000 mL | INTRAMUSCULAR | Status: AC | PRN
  Administered 2017-09-06: 30 mL via SUBCUTANEOUS
  Filled 2017-09-06: qty 30

## 2017-09-06 MED ORDER — ONDANSETRON HCL 4 MG PO TABS: 4.0000 mg | ORAL_TABLET | ORAL | Status: DC | PRN

## 2017-09-06 MED ORDER — FENTANYL 2.5 MCG/ML BUPIVACAINE 1/10 % EPIDURAL INFUSION (WH - ANES)
14.0000 mL/h | INTRAMUSCULAR | Status: DC | PRN
  Administered 2017-09-06: 12 mL/h via EPIDURAL
  Filled 2017-09-06: qty 100

## 2017-09-06 MED ORDER — OXYTOCIN 40 UNITS IN LACTATED RINGERS INFUSION - SIMPLE MED
2.5000 [IU]/h | INTRAVENOUS | Status: DC
  Filled 2017-09-06: qty 1000

## 2017-09-06 NOTE — Anesthesia Procedure Notes (Signed)
Epidural Patient location during procedure: OB Start time: 09/06/2017 11:30 AM End time: 09/06/2017 11:49 AM  Staffing Anesthesiologist: Jairo BenJACKSON, Shantara Goosby Performed: anesthesiologist   Preanesthetic Checklist Completed: patient identified, surgical consent, pre-op evaluation, timeout performed, IV checked, risks and benefits discussed and monitors and equipment checked  Epidural Patient position: sitting Prep: site prepped and draped and DuraPrep Patient monitoring: blood pressure, continuous pulse ox and heart rate Approach: midline Location: L2-L3 Injection technique: LOR air  Needle:  Needle type: Tuohy  Needle gauge: 17 G Needle length: 9 cm Needle insertion depth: 4.5 cm Catheter type: closed end flexible Catheter size: 19 Gauge Catheter at skin depth: 10 cm Test dose: negative (1% lidocaine)  Assessment Events: blood not aspirated, injection not painful, no injection resistance, negative IV test and no paresthesia  Additional Notes Pt identified in Labor room.  Monitors applied. Working IV access confirmed. Sterile prep, drape lumbar spine.  1% lido local L 2,3.  #17ga Touhy LOR air at 4.5 cm L 2,3, cath in easily to 10 cm skin. Test dose OK, cath dosed and infusion begun.  Patient asymptomatic, VSS, no heme aspirated, tolerated well.  Sandford Craze Rockney Grenz, MDReason for block:procedure for pain

## 2017-09-06 NOTE — MAU Note (Signed)
contractions started yesterday.  Have gotten closer and stronger  Was closed when last checked.  Small amt of brownish mucous, ? Small amt of fluid.

## 2017-09-06 NOTE — H&P (Signed)
OBSTETRIC ADMISSION HISTORY AND PHYSICAL  [name] is a 27 y.o. female [id number] with IUP at [redacted]w[redacted]d by LMP presenting for spontaneous onset of labor.  She reports +FMs, No LOF, no VB, no blurry vision, headaches or peripheral edema or RUQ pain.  This pregnancy has been complicated by late [facility] in second trimester.  She plans on breast feeding. She is undecided on birth control but is interested in IUD. She received her prenatal care at [facility].   Dating: By LMP --->  Estimated Date of Delivery: 09/25/17  Sono:    , CWD, normal anatomy, cephalic presentation, 476g, 49th% EFW   Prenatal History/Complications:  Clinic  [facility] Prenatal Labs  Dating  LMP Blood type:   O+  Genetic Screen 1 Screen:    AFP:     Quad:     NIPS: too late Antibody:  negative  Anatomic [place]  normal - female - anterior placenta Rubella:  immune  GTT  Third trimester: 71 RPR:   negative  Flu vaccine  Declines HBsAg:   negative  TDaP vaccine      08/08/17                                         Rhogam: N/A HIV:   negative  Baby Food  breast                                             GBS: POSITIVE (For PCN allergy, check sensitivities)  Contraception  desires, unsure what kind Pap: normal 2016 - records requested  Circumcision  N/A   Pediatrician  [facility]   Support Person  Sister Danie Chandler     Past Medical History: Past Medical History:  Diagnosis Date  . Anemia   . Depression    Was on Zoloft    Past Surgical History: Past Surgical History:  Procedure Laterality Date  . WISDOM TOOTH EXTRACTION  2011    Obstetrical History: OB History    Gravida Para Term Preterm AB Living   0 2   SAB TAB Ectopic Multiple Live Births   0 0 0 0 2      Social History: Social History   Social History  . Marital status: Single    Spouse name: N/A  . Number of children: N/A  . Years of education: N/A   Social History Main Topics  . Smoking status: Never Smoker  . Smokeless tobacco:  Never Used  . Alcohol use No     Comment: not since pregnant, but before that on weekends  . Drug use: No  . Sexual activity: Yes    Birth control/ protection: None   Other Topics Concern  . None   Social History Narrative  . None    Family History: Family History  Problem Relation Age of Onset  . Asthma Sister   . Asthma Brother     Allergies: No Known Allergies  Prescriptions Prior to Admission  Medication Sig Dispense Refill Last Dose  . ferrous sulfate 325 (65 FE) MG tablet Take 1 tablet (325 mg total) by mouth daily. 30 tablet 3 09/05/2017 at Unknown time  . Prenatal Vit-Fe Fumarate-FA (PRENATAL MULTIVITAMIN) TABS Take 1 tablet by mouth daily.  09/05/2017 at Unknown time  . acetaminophen (TYLENOL) 500 MG tablet Take 1,000 mg by mouth every 6 (six) hours as needed for mild pain.   prn     Review of Systems   All systems reviewed and negative except as stated in HPI  Physical Exam Blood pressure 111/73, pulse 69, temperature 98.4 F (36.9 C), temperature source Oral, resp. rate 18, height 5\' 2"  (1.575 m), weight 73.5 kg (162 lb), last menstrual period 12/19/2016, SpO2 100 %, not currently breastfeeding. General appearance: alert and cooperative Lungs: clear to auscultation bilaterally Heart: regular rate and rhythm Abdomen: soft, non-tender; bowel sounds normal Pelvic: SVE: 5-6/90/-2 Extremities: Homans sign is negative, no sign of DVT Presentation: cephalic Fetal monitoringBaseline: 150 bpm, moderate variability, +accels, no decel Uterine activity  Regular, q2-4 min   Prenatal labs: ABO, Rh: O/Positive/-- (05/21 1355) Antibody: Negative (05/21 1355) Rubella: 2.49 (05/21 1355) RPR: Non Reactive (08/09 1343)  HBsAg: Negative (05/21 1355)  HIV:   Negative GBS:   POSITIVE 1 hr Glucola third trimester 71 Genetic screening  N/A Anatomy US  WNL female  Prenatal Transfer Tool  Maternal Diabetes: No Genetic Screening: Declined Maternal Ultrasounds/Referrals:  Normal Fetal Ultrasounds or other Referrals:  None Maternal Substance Abuse:  No Significant Maternal Medications:  None Significant Maternal Lab Results: Lab values include: Group B Strep positive  No results found for this or any previous visit (from the past 24 hour(s)).  Patient Active Problem List   Diagnosis Date Noted  . Normal labor 09/06/2017  . Supervision of other normal pregnancy, antepartum 05/20/2017  . History of preterm delivery, currently pregnant, third trimester 05/20/2017  . Anemia, iron deficiency 03/30/2013  . Late prenatal care affecting pregnancy in second trimester 03/06/2013    Assessment/Plan:  [name] is a 27 y.o. [id number] at 3128w2d here for spontaneous onset of labor.   #Labor: Progressing naturally, monitor closely #Pain: Epidural desired #FWB: Category I #ID:  GBS positive; start Amp now #MOF: Breast #MOC: Undecided, interested in IUD #Circ:  N/A  Anticipate NSVD.   Jearld LeschMary K Key, DO PGY-2 09/06/2017, 11:13 AM  OB FELLOW HISTORY AND PHYSICAL ATTESTATION  I have seen and examined this patient; I agree with above documentation in the resident's note.   Frederik PearJulie P Degele, MD OB Fellow 09/06/2017, 12:03 PM

## 2017-09-06 NOTE — Anesthesia Pain Management Evaluation Note (Addendum)
  CRNA Pain Management Visit Note  Patient: [name], 27 y.o., female  "Hello I am a member of the anesthesia team at [facility]. We have an anesthesia team available at all times to provide care throughout the hospital, including epidural management and anesthesia for C-section. I don't know your plan for the delivery whether it a natural birth, water birth, IV sedation, nitrous supplementation, doula or epidural, but we want to meet your pain goals."   1.Was your pain managed to your expectations on prior hospitalizations?   Yes   2.What is your expectation for pain management during this hospitalization?     Epidural  3.How can we help you reach that goal? Epidural in place.  Record the patient's initial score and the patient's pain goal.   Pain: 6 in lower abdomen per patient. After epidural placement, she claimed to have better pain control but has since started feeling pain/pressure (not just pressure). I informed the patient's L&D RN and gave instruction to the patient about her PCEA button. The patient pressed her button. I informed the patient and L&D RN Joni Reiningicole to let me know if patient is still having pain in 20-30 minutes and I would come reassess.  Pain Goal: Goal is 0 (9/10 prior to epidural) [facility] wants you to be able to say your pain was always managed very well.  Nazirah Tri L 09/06/2017

## 2017-09-06 NOTE — Anesthesia Preprocedure Evaluation (Addendum)
Anesthesia Evaluation  Patient identified by MRN, date of birth, ID band Patient awake    Reviewed: Allergy & Precautions, NPO status , Patient's Chart, lab work & pertinent test results  History of Anesthesia Complications Negative for: history of anesthetic complications  Airway Mallampati: II  TM Distance: >3 FB Neck ROM: Full    Dental  (+) Dental Advisory Given   Pulmonary neg pulmonary ROS,    breath sounds clear to auscultation       Cardiovascular negative cardio ROS   Rhythm:Regular Rate:Normal     Neuro/Psych negative neurological ROS     GI/Hepatic negative GI ROS, Neg liver ROS,   Endo/Other  negative endocrine ROS  Renal/GU negative Renal ROS     Musculoskeletal   Abdominal   Peds  Hematology Hb 9.5, plt 195k   Anesthesia Other Findings   Reproductive/Obstetrics (+) Pregnancy                            Anesthesia Physical Anesthesia Plan  ASA: II  Anesthesia Plan: Epidural   Post-op Pain Management:    Induction:   PONV Risk Score and Plan: Treatment may vary due to age or medical condition  Airway Management Planned: Natural Airway  Additional Equipment:   Intra-op Plan:   Post-operative Plan:   Informed Consent: I have reviewed the patients History and Physical, chart, labs and discussed the procedure including the risks, benefits and alternatives for the proposed anesthesia with the patient or authorized representative who has indicated his/her understanding and acceptance.   Dental advisory given  Plan Discussed with:   Anesthesia Plan Comments: (Patient identified. Risks/Benefits/Options discussed with patient including but not limited to bleeding, infection, nerve damage, paralysis, failed block, incomplete pain control, headache, blood pressure changes, nausea, vomiting, reactions to medication both or allergic, itching and postpartum back pain.  Confirmed with bedside nurse the patient's most recent platelet count. Confirmed with patient that they are not currently taking any anticoagulation, have any bleeding history or any family history of bleeding disorders. Patient expressed understanding and wished to proceed. All questions were answered. )       Anesthesia Quick Evaluation

## 2017-09-06 NOTE — Anesthesia Postprocedure Evaluation (Signed)
Anesthesia Post Note  Patient: Grenada[place]ittany Morrell  Procedure(s) Performed: * No procedures listed *     Patient location during evaluation: Mother Baby Anesthesia Type: Epidural Level of consciousness: awake and alert and oriented Pain management: satisfactory to patient Vital Signs Assessment: post-procedure vital signs reviewed and stable Respiratory status: spontaneous breathing and nonlabored ventilation Cardiovascular status: stable Postop Assessment: no headache, no backache, no signs of nausea or vomiting, adequate PO intake and patient able to bend at knees (patient up walking) Anesthetic complications: no    Last Vitals:  Vitals:   09/06/17 1600 09/06/17 1700  BP: (!) 118/58 116/64  Pulse: 67 67  Resp: 18 16  Temp: 36.9 C 37.3 C  SpO2:      Last Pain:  Vitals:   09/06/17 1700  TempSrc: Oral  PainSc: 3    Pain Goal:                 Katriel Cutsforth

## 2017-09-07 LAB — CBC
HCT: 23.9 % — ABNORMAL LOW (ref 36.0–46.0)
HEMOGLOBIN: 8.1 g/dL — AB (ref 12.0–15.0)
MCH: 28.1 pg (ref 26.0–34.0)
MCHC: 33.9 g/dL (ref 30.0–36.0)
MCV: 83 fL (ref 78.0–100.0)
Platelets: 151 10*3/uL (ref 150–400)
RBC: 2.88 MIL/uL — ABNORMAL LOW (ref 3.87–5.11)
RDW: 14.1 % (ref 11.5–15.5)
WBC: 10.9 10*3/uL — ABNORMAL HIGH (ref 4.0–10.5)

## 2017-09-07 LAB — RPR: RPR Ser Ql: NONREACTIVE

## 2017-09-07 MED ORDER — OXYCODONE HCL 5 MG PO TABS
5.0000 mg | ORAL_TABLET | Freq: Four times a day (QID) | ORAL | Status: DC | PRN
  Administered 2017-09-07 – 2017-09-08 (×2): 5 mg via ORAL
  Filled 2017-09-07 (×2): qty 1

## 2017-09-07 MED ORDER — OXYCODONE HCL 5 MG PO TABS
5.0000 mg | ORAL_TABLET | Freq: Once | ORAL | Status: AC
  Administered 2017-09-07: 5 mg via ORAL
  Filled 2017-09-07: qty 1

## 2017-09-07 NOTE — Progress Notes (Signed)
Post Partum Day 1 Subjective: Doing well.  No concerns.  Breast feeding.  Minimal lochia.  Pain controlled.  Tolerating PO.  Ambulating well.  Objective: Blood pressure 110/66, pulse 62, temperature 98.7 F (37.1 C), temperature source Oral, resp. rate 18, height 5\' 2"  (1.575 m), weight 73.5 kg (162 lb), last menstrual period 12/19/2016, SpO2 100 %, unknown if currently breastfeeding.  Physical Exam:  General: alert and cooperative Lochia: appropriate Uterine Fundus: firm Incision: N/A DVT Evaluation: No evidence of DVT seen on physical exam.   Recent Labs  09/06/17 1050 09/07/17 0532  HGB 9.5* 8.1*  HCT 28.2* 23.9*    Assessment/Plan: [id number] s/p SVD, doing well Continue routine postpartum care.  Discharge likely tomorrow due to infant care (GBS positive status and inadequate treatment prior to delivery).    LOS: 1 day   Janet BeachMary K Key, DO PGY-2 09/07/2017, 7:52 AM   CNM attestation Post Partum Day #1 I have seen and examined this patient and agree with above documentation in the resident's note.   [name] is a 27 y.o. [id number] s/p SVD.  Pt denies problems with ambulating, voiding or po intake. Pain is well controlled.  Plan for birth control is IUD- considering.  Method of Feeding: breast  PE:  BP 110/66 (BP Location: Right Arm)   Pulse 62   Temp 98.7 F (37.1 C) (Oral)   Resp 18   Ht 5\' 2"  (1.575 m)   Wt 73.5 kg (162 lb)   LMP 12/19/2016   SpO2 100%   Breastfeeding? Unknown   BMI 29.63 kg/m  Fundus firm  Plan for discharge: 09/08/17  Cam HaiSHAW, KIMBERLY, CNM 8:39 AM 09/07/2017

## 2017-09-07 NOTE — Lactation Note (Signed)
This note was copied from a baby's chart. Lactation Consultation Note  Patient Name: [name] Today's Date: 09/07/2017 Reason for consult: Initial assessment;Early term 37-38.6wks   P3, Ex BF.  6358w2d.   Baby latched in cradle hold on L breast upon entering. Good sucks and swallows observed. Had mother hand express from right side.  Great flow of colostrum. Gave baby an additional 4 ml on spoon. Recommend spoon feeding or pumping after each feeding due to infant's age. Mom encouraged to feed baby 8-12 times/24 hours and with feeding cues.  Limit feedings to 30 min. Provided mother w/ LPI information and told her to call if baby become too sleepy to breastfeed. Mom made aware of O/P services, breastfeeding support groups, community resources, and our phone # for post-discharge questions.      Maternal Data Has patient been taught Hand Expression?: Yes Does the patient have breastfeeding experience prior to this delivery?: Yes  Feeding Feeding Type: Breast Fed Length of feed: 20 min  LATCH Score Latch: Grasps breast easily, tongue down, lips flanged, rhythmical sucking.  Audible Swallowing: A few with stimulation  Type of Nipple: Everted at rest and after stimulation  Comfort (Breast/Nipple): Soft / non-tender  Hold (Positioning): No assistance needed to correctly position infant at breast.  LATCH Score: 9  Interventions    Lactation Tools Discussed/Used     Consult Status Consult Status: Follow-up Date: 09/08/17 Follow-up type: In-patient    Dahlia ByesBerkelhammer, Denaya Horn [facility] 09/07/2017, 3:39 PM

## 2017-09-07 NOTE — Progress Notes (Signed)
MOB was referred for history of depression.  Referral is screened out by Clinical Social Worker because none of the following criteria appear to apply and there are no reports impacting the pregnancy or her transition to the postpartum period. CSW does not deem it clinically necessary to further investigate at this time.  -History of anxiety/depression during this pregnancy, or of post-partum depression. - Diagnosis of anxiety and/or depression within last 3 years. - History of depression due to pregnancy loss/loss of child or -MOB's symptoms are currently being treated with medication and/or therapy.  CSW completed chart review to include PNC records and current/previous medication list. MOB has never received medication for depression currently or in the past. Additionally, depression is not mentioned in MOB's PNC records as a previous or current dx. Please contact the Clinical Social Worker if needs arise or upon MOB request.   Fillmore Bynum, MSW, LCSW-A Clinical Social Worker  [facility]  Office: [phone]  

## 2017-09-08 MED ORDER — IBUPROFEN 600 MG PO TABS: 600.0000 mg | ORAL_TABLET | Freq: Four times a day (QID) | ORAL | 0 refills | Status: DC

## 2017-09-08 NOTE — Discharge Summary (Signed)
OB Discharge Summary     Patient Name: Joseph Joseph DOB: [birth year] MRN: [id number]  Date of admission: 09/06/2017 Delivering MD: Frederik Pear   Date of discharge: 09/08/2017  Admitting diagnosis: 37WKS, CTX,LEAKING Intrauterine pregnancy: [redacted]w[redacted]d     Secondary diagnosis:  Principal Problem:   NSVD (normal spontaneous vaginal delivery) Active Problems:   Late prenatal care affecting pregnancy in second trimester   Anemia, iron deficiency  Additional problems: hx of preterm delivery     Discharge diagnosis: Term Pregnancy Delivered                                                                                                Post partum procedures:none  Augmentation: none  Complications: None  Hospital course:  Onset of Labor With Vaginal Delivery     27 y.o. yo [phone] at [redacted]w[redacted]d was admitted in Active Labor on 09/06/2017. Patient had an uncomplicated labor course as follows:  Membrane Rupture Time/Date: 1:25 PM ,09/06/2017   Intrapartum Procedures: Episiotomy: None [1]                                         Lacerations:  2nd degree [3]  Patient had a delivery of a Viable infant. 09/06/2017  Information for the patient's newborn:  Joseph Joseph [[id number]]  Delivery Method: Vaginal, Spontaneous Delivery (Filed from Delivery Summary)    Pateint had an uncomplicated postpartum course.  She is ambulating, tolerating a regular diet, passing flatus, and urinating well. Patient is discharged home in stable condition on 09/08/17.   Physical exam  Vitals:   09/06/17 1700 09/07/17 0535 09/07/17 1826 09/08/17 0558  BP: 116/64 110/66 108/72 126/78  Pulse: 67 62 64 65  Resp: Temp: 99.1 F (37.3 C) 98.7 F (37.1 C) 98.4 F (36.9 C) 98 F (36.7 C)  TempSrc: Oral Oral Oral Oral  SpO2:  100% 100%   Weight:      Height:       General: alert, cooperative and no distress Lochia: appropriate Uterine Fundus: firm Incision: N/A DVT Evaluation: No evidence of DVT  seen on physical exam. No significant calf/ankle edema. Labs: Lab Results  Component Value Date   WBC 10.9 (H) 09/07/2017   HGB 8.1 (L) 09/07/2017   HCT 23.9 (L) 09/07/2017   MCV 83.0 09/07/2017   PLT 151 09/07/2017   CMP Latest Ref Rng & Units 02/01/2015  Glucose 70 - 99 mg/dL 89  BUN 6 - 23 mg/dL 9  Creatinine 4.40 - 3.47 mg/dL 4.25  Sodium 956 - 387 mmol/L 132(L)  Potassium 3.5 - 5.1 mmol/L 3.4(L)  Chloride 96 - 112 mmol/L 102  CO2 19 - 32 mmol/L 23  Calcium 8.4 - 10.5 mg/dL 8.0(L)  Total Protein 6.0 - 8.3 g/dL 6.3  Total Bilirubin 0.3 - 1.2 mg/dL 0.5  Alkaline Phos 39 - 117 U/L 42  AST 0 - 37 U/L 20  ALT 0 - 35 U/L 14    Discharge instruction: per  After Visit Summary and "Baby and Me Booklet".  After visit meds:  Allergies as of 09/08/2017   No Known Allergies     Medication List    TAKE these medications   acetaminophen 500 MG tablet Commonly known as:  TYLENOL Take 1,000 mg by mouth every 6 (six) hours as needed for mild pain.   ferrous sulfate 325 (65 FE) MG tablet Take 1 tablet (325 mg total) by mouth daily.   ibuprofen 600 MG tablet Commonly known as:  ADVIL,MOTRIN Take 1 tablet (600 mg total) by mouth every 6 (six) hours.   prenatal multivitamin Tabs tablet Take 1 tablet by mouth daily.            Discharge Care Instructions        Start     Ordered   09/08/17 0000  ibuprofen (ADVIL,MOTRIN) 600 MG tablet  Every 6 hours     09/08/17 0826   09/08/17 0000  Call MD for:  temperature >100.4     09/08/17 0826   09/08/17 0000  Call MD for:  persistant nausea and vomiting     09/08/17 0826   09/08/17 0000  Call MD for:  severe uncontrolled pain     09/08/17 0826   09/08/17 0000  Call MD for:  difficulty breathing, headache or visual disturbances     09/08/17 0826   09/08/17 0000  Call MD for:  persistant dizziness or light-headedness     09/08/17 0826   09/08/17 0000  Sexual acrtivity    Comments:  No sexual activity or anything in the vagina  for 6 weeks   09/08/17 0826   09/08/17 0000  Diet - low sodium heart healthy     09/08/17 0826   09/06/17 0000  OB RESULT CONSOLE Group B Strep    Comments:  This external order was created through the Results Console.   09/06/17 1113      Diet: routine diet  Activity: Advance as tolerated. Pelvic rest for 6 weeks.   Outpatient follow up: in 4-6 weeeks  Postpartum contraception: IUD .  Newborn Data: Live born female  Birth Weight: 7 lb 1.2 oz (3210 g) APGAR: 9, 9  Baby Feeding: Breast Disposition:home with mother   09/08/2017 Frederik PearJulie P Degele, MD

## 2017-09-08 NOTE — Discharge Instructions (Signed)

## 2017-09-09 ENCOUNTER — Telehealth: Payer: Self-pay | Admitting: Advanced Practice Midwife

## 2017-09-09 NOTE — Telephone Encounter (Signed)
Want to speak with a nurse about her RX

## 2017-09-10 ENCOUNTER — Encounter: Payer: Self-pay | Admitting: General Practice

## 2017-09-12 ENCOUNTER — Encounter: Payer: Self-pay | Admitting: Advanced Practice Midwife

## 2017-09-16 NOTE — Telephone Encounter (Signed)
Called patient, no answer- left message stating we are trying to reach you to see if you still need assistance, if so please call us back. Will send letter

## 2017-09-20 ENCOUNTER — Encounter: Payer: Self-pay | Admitting: Obstetrics & Gynecology

## 2017-10-16 ENCOUNTER — Ambulatory Visit: Payer: Medicaid Other | Admitting: Student

## 2018-12-31 NOTE — L&D Delivery Note (Addendum)
[facility] Delivery Note  [name] is a 29 y.o. [id number] s/p SVD at [redacted]w[redacted]d. She was admitted for SOL  ROM: 1h 82m with clear fluid GBS Status: --Henderson Cloud (10/19 1558)  Labor Progress: Patient presented to L&D for SOL. Presented at 5 cm and was augmented with low dose pitocin and AROM progressing to complete.   Delivery Date/Time: 10/29/19 at 1249  Delivery: Called to room and patient was complete and pushing. Head delivered LOA. No nuchal cord present. Shoulder and body delivered in usual fashion. Infant with spontaneous cry, placed on mother's abdomen, dried and stimulated. Cord clamped x 2 after 1-minute delay, and cut by father. Cord blood drawn. Placenta delivered spontaneously with gentle cord traction. Fundus firm with massage and Pitocin. Labia, perineum, vagina, and cervix inspected inspected with 2nd degree perineal laceration repaired in usual fashion with 4-0 vicryl  Baby Weight: Pending   Placenta: Sent to L&D Complications: none Lacerations: 2nd degree laceration  EBL: 500  Analgesia: Epidural  Infant: APGAR (1 MIN): 9   APGAR (5 MINS): 9   APGAR (10 MINS):     Maxie Better, MD, PGY1  [facility] 10/29/2019, 1:44 PM

## 2019-03-30 ENCOUNTER — Inpatient Hospital Stay (HOSPITAL_COMMUNITY): Payer: Medicaid Other

## 2019-03-30 ENCOUNTER — Encounter (HOSPITAL_COMMUNITY): Payer: Self-pay

## 2019-03-30 ENCOUNTER — Inpatient Hospital Stay (HOSPITAL_COMMUNITY)
Admission: AD | Admit: 2019-03-30 | Discharge: 2019-03-30 | Disposition: A | Discharge status: Home / Self Care | Payer: Medicaid Other | Source: Ambulatory Visit | Attending: Obstetrics & Gynecology | Admitting: Obstetrics & Gynecology

## 2019-03-30 ENCOUNTER — Other Ambulatory Visit: Payer: Self-pay

## 2019-03-30 DIAGNOSIS — R1031 Right lower quadrant pain: Secondary | ICD-10-CM | POA: Diagnosis not present

## 2019-03-30 DIAGNOSIS — O26891 Other specified pregnancy related conditions, first trimester: Secondary | ICD-10-CM | POA: Diagnosis not present

## 2019-03-30 DIAGNOSIS — Z791 Long term (current) use of non-steroidal anti-inflammatories (NSAID): Secondary | ICD-10-CM | POA: Diagnosis not present

## 2019-03-30 DIAGNOSIS — Z3A08 8 weeks gestation of pregnancy: Secondary | ICD-10-CM | POA: Diagnosis not present

## 2019-03-30 DIAGNOSIS — O99341 Other mental disorders complicating pregnancy, first trimester: Secondary | ICD-10-CM | POA: Insufficient documentation

## 2019-03-30 DIAGNOSIS — R824 Acetonuria: Secondary | ICD-10-CM | POA: Diagnosis not present

## 2019-03-30 DIAGNOSIS — R109 Unspecified abdominal pain: Secondary | ICD-10-CM

## 2019-03-30 DIAGNOSIS — F329 Major depressive disorder, single episode, unspecified: Secondary | ICD-10-CM | POA: Diagnosis not present

## 2019-03-30 DIAGNOSIS — Z3201 Encounter for pregnancy test, result positive: Secondary | ICD-10-CM | POA: Insufficient documentation

## 2019-03-30 DIAGNOSIS — O26831 Pregnancy related renal disease, first trimester: Secondary | ICD-10-CM | POA: Insufficient documentation

## 2019-03-30 DIAGNOSIS — O9989 Other specified diseases and conditions complicating pregnancy, childbirth and the puerperium: Secondary | ICD-10-CM | POA: Diagnosis not present

## 2019-03-30 DIAGNOSIS — O219 Vomiting of pregnancy, unspecified: Secondary | ICD-10-CM | POA: Diagnosis not present

## 2019-03-30 DIAGNOSIS — O26899 Other specified pregnancy related conditions, unspecified trimester: Secondary | ICD-10-CM

## 2019-03-30 DIAGNOSIS — Z3A09 9 weeks gestation of pregnancy: Secondary | ICD-10-CM | POA: Diagnosis not present

## 2019-03-30 LAB — COMPREHENSIVE METABOLIC PANEL
ALT: 18 U/L (ref 0–44)
AST: 19 U/L (ref 15–41)
Albumin: 3.4 g/dL — ABNORMAL LOW (ref 3.5–5.0)
Alkaline Phosphatase: 39 U/L (ref 38–126)
Anion gap: 6 (ref 5–15)
BILIRUBIN TOTAL: 0.6 mg/dL (ref 0.3–1.2)
BUN: 7 mg/dL (ref 6–20)
CO2: 22 mmol/L (ref 22–32)
CREATININE: 0.59 mg/dL (ref 0.44–1.00)
Calcium: 8.5 mg/dL — ABNORMAL LOW (ref 8.9–10.3)
Chloride: 105 mmol/L (ref 98–111)
GFR calc Af Amer: >60 mL/min (ref >60)
GFR calc non Af Amer: >60 mL/min (ref >60)
Glucose, Bld: 91 mg/dL (ref 70–99)
POTASSIUM: 3.5 mmol/L (ref 3.5–5.1)
Sodium: 133 mmol/L — ABNORMAL LOW (ref 135–145)
TOTAL PROTEIN: 6.3 g/dL — AB (ref 6.5–8.1)

## 2019-03-30 LAB — CBC WITH DIFFERENTIAL/PLATELET
ABS IMMATURE GRANULOCYTES: 0.01 10*3/uL (ref 0.00–0.07)
BASOS ABS: 0 10*3/uL (ref 0.0–0.1)
Basophils Relative: 0 %
Eosinophils Absolute: 0 10*3/uL (ref 0.0–0.5)
Eosinophils Relative: 1 %
HEMATOCRIT: 35.2 % — AB (ref 36.0–46.0)
Hemoglobin: 11.4 g/dL — ABNORMAL LOW (ref 12.0–15.0)
IMMATURE GRANULOCYTES: 0 %
LYMPHS ABS: 1.8 10*3/uL (ref 0.7–4.0)
LYMPHS PCT: 26 %
MCH: 26.2 pg (ref 26.0–34.0)
MCHC: 32.4 g/dL (ref 30.0–36.0)
MCV: 80.9 fL (ref 80.0–100.0)
MONOS PCT: 8 %
Monocytes Absolute: 0.5 10*3/uL (ref 0.1–1.0)
NEUTROS ABS: 4.4 10*3/uL (ref 1.7–7.7)
NEUTROS PCT: 65 %
Platelets: 226 10*3/uL (ref 150–400)
RBC: 4.35 MIL/uL (ref 3.87–5.11)
RDW: 13.3 % (ref 11.5–15.5)
WBC: 6.7 10*3/uL (ref 4.0–10.5)
nRBC: 0 % (ref 0.0–0.2)

## 2019-03-30 LAB — WET PREP, GENITAL
CLUE CELLS WET PREP: NONE SEEN
Sperm: NONE SEEN
Trich, Wet Prep: NONE SEEN
Yeast Wet Prep HPF POC: NONE SEEN

## 2019-03-30 LAB — URINALYSIS, ROUTINE W REFLEX MICROSCOPIC
Bilirubin Urine: NEGATIVE
GLUCOSE, UA: NEGATIVE mg/dL
HGB URINE DIPSTICK: NEGATIVE
Ketones, ur: 80 mg/dL — AB
Leukocytes,Ua: NEGATIVE
NITRITE: NEGATIVE
PH: 6 (ref 5.0–8.0)
PROTEIN: 30 mg/dL — AB
SPECIFIC GRAVITY, URINE: 1.034 — AB (ref 1.005–1.030)

## 2019-03-30 LAB — POCT PREGNANCY, URINE: Preg Test, Ur: POSITIVE — AB

## 2019-03-30 LAB — HCG, QUANTITATIVE, PREGNANCY: HCG, BETA CHAIN, QUANT, S: 209677 m[IU]/mL — AB (ref <5)

## 2019-03-30 MED ORDER — M.V.I. ADULT IV INJ
Freq: Once | INTRAVENOUS | Status: DC
  Filled 2019-03-30: qty 10

## 2019-03-30 MED ORDER — ONDANSETRON 4 MG PO TBDP
4.0000 mg | ORAL_TABLET | Freq: Once | ORAL | Status: AC
  Administered 2019-03-30: 4 mg via ORAL
  Filled 2019-03-30: qty 1

## 2019-03-30 MED ORDER — METOCLOPRAMIDE HCL 10 MG PO TABS: 10.0000 mg | ORAL_TABLET | Freq: Four times a day (QID) | ORAL | 0 refills | Status: DC

## 2019-03-30 MED ORDER — PRENATAL ADULT GUMMY/DHA/FA 0.4-25 MG PO CHEW: 1.0000 | CHEWABLE_TABLET | Freq: Every day | ORAL | 6 refills | Status: AC

## 2019-03-30 MED ORDER — DOXYLAMINE-PYRIDOXINE ER 20-20 MG PO TBCR: 1.0000 | EXTENDED_RELEASE_TABLET | Freq: Two times a day (BID) | ORAL | 0 refills | Status: DC

## 2019-03-30 MED ORDER — LACTATED RINGERS IV BOLUS
1000.0000 mL | Freq: Once | INTRAVENOUS | Status: AC
  Administered 2019-03-30: 1000 mL via INTRAVENOUS

## 2019-03-30 NOTE — MAU Note (Addendum)
Pt having n/v that started over a week. Having generalized body aches. Rates 8/10. No fever or respiratory symptoms. No bleeding, HPT positive. Pt extremely anxious and tearful.

## 2019-03-30 NOTE — Discharge Instructions (Signed)
Symptoms of this condition include:  Nausea that does not go away.  Vomiting that does not allow you to keep any food down.  Weight loss.  Body fluid loss (dehydration).  Having no desire to eat, or not liking food that you have previously enjoyed. How is this diagnosed? This condition may be diagnosed based on:  A physical exam.  Your medical history.  Your symptoms.  Blood tests.  Urine tests. How is this treated? This condition is managed by controlling symptoms. This may include:  Following an eating plan. This can help lessen nausea and vomiting.  Taking prescription medicines. An eating plan and medicines are often used together to help control symptoms. If medicines do not help relieve nausea and vomiting, you may need to receive fluids through an IV at the hospital. Follow these instructions at home: Eating and drinking   Avoid the following: ? Drinking fluids with meals. Try not to drink anything during the 30 minutes before and after your meals. ? Drinking more than 1 cup of fluid at a time. ? Eating foods that trigger your symptoms. These may include spicy foods, coffee, high-fat foods, very sweet foods, and acidic foods. ? Skipping meals. Nausea can be more intense on an empty stomach. If you cannot tolerate food, do not force it. Try sucking on ice chips or other frozen items and make up for missed calories later. ? Lying down within 2 hours after eating. ? Being exposed to environmental triggers. These may include food smells, smoky rooms, closed spaces, rooms with strong smells, warm or humid places, overly loud and noisy rooms, and rooms with motion or flickering lights. Try eating meals in a well-ventilated area that is free of strong smells. ? Quick and sudden changes in your movement. ? Taking iron pills and multivitamins that contain iron. If you take prescription iron pills, do not stop taking them unless your health care provider approves. ? Preparing  food. The smell of food can spoil your appetite or trigger nausea.  To help relieve your symptoms: ? Listen to your body. Everyone is different and has different preferences. Find what works best for you. ? Eat and drink slowly. ? Eat 5-6 small meals daily instead of 3 large meals. Eating small meals and snacks can help you avoid an empty stomach. ? In the morning, before getting out of bed, eat a couple of crackers to avoid moving around on an empty stomach. ? Try eating starchy foods as these are usually tolerated well. Examples include cereal, toast, bread, potatoes, pasta, rice, and pretzels. ? Include at least 1 serving of protein with your meals and snacks. Protein options include lean meats, poultry, seafood, beans, nuts, nut butters, eggs, cheese, and yogurt. ? Try eating a protein-rich snack before bed. Examples of a protein-rick snack include cheese and crackers or a peanut butter sandwich made with 1 slice of whole-wheat bread and 1 tsp (5 g) of peanut butter. ? Eat or suck on things that have ginger in them. It may help relieve nausea. Add  tsp ground ginger to hot tea or choose ginger tea. ? Try drinking 100% fruit juice or an electrolyte drink. An electrolyte drink contains sodium, potassium, and chloride. ? Drink fluids that are cold, clear, and carbonated or sour. Examples include lemonade, ginger ale, lemon-lime soda, ice water, and sparkling water. ? Brush your teeth or use a mouth rinse after meals. ? Talk with your health care provider about starting a supplement of vitamin B6. General  instructions  Take over-the-counter and prescription medicines only as told by your health care provider.  Follow instructions from your health care provider about eating or drinking restrictions.  Continue to take your prenatal vitamins as told by your health care provider. If you are having trouble taking your prenatal vitamins, talk with your health care provider about different  options.  Keep all follow-up and pre-birth (prenatal) visits as told by your health care provider. This is important. Contact a health care provider if:  You have pain in your abdomen.  You have a severe headache.  You have vision problems.  You are losing weight.  You feel weak or dizzy. Get help right away if:  You cannot drink fluids without vomiting.  You vomit blood.  You have constant nausea and vomiting.  You are very weak.  You faint.  You have a fever and your symptoms suddenly get worse. Summary  Making some changes to your eating habits may help relieve nausea and vomiting.  This condition may be managed with medicine.  If medicines do not help relieve nausea and vomiting, you may need to receive fluids through an IV at the hospital. This information is not intended to replace advice given to you by your health care provider. Make sure you discuss any questions you have with your health care provider. Document Released: 12/17/2005 Document Revised: 01/06/2018 Document Reviewed: 08/15/2016 Elsevier Interactive Patient Education  2019 [employer].

## 2019-03-30 NOTE — MAU Provider Note (Signed)
History     CSN: [id number]  Arrival date and time: 03/30/19 1321   First Provider Initiated Contact with Patient 03/30/19 1414     Chief Complaint  Patient presents with  . Emesis  . Generalized Body Aches  . Nausea   HPI [name] is a 29 y.o. [id number] at 8w 0d by uncertain LMP who presents to MAU with chief complaint of nausea, vomiting and body aches. These are recurring problems, onset two weeks ago. She denies travel outside [place], exposure to ill people, vaginal bleeding, abnormal vaginal discharge, fever, cough, SOB or urinary symptoms.  Patient also reports secondary complaint of lower abdominal cramping with one episode of "sharp" and "very painful" RLQ pain earlier today. Her pain was 8/10 but resolved without intervention. Her pain did not radiate. She has not taken medication or tried other treatments for management of either her mild cramping or the episode of sharp pain.  Patient is very tearful upon CNM introduction. She is concerned about being away from her children and in the hospital. This is an unplanned pregnancy and she is nervous about her ability to support another child.  OB History    Gravida  4   Para  3   Term  2   Preterm  1   AB  0   Living  3     SAB  0   TAB  0   Ectopic  0   Multiple  0   Live Births  3           Past Medical History:  Diagnosis Date  . Anemia   . Depression    Was on Zoloft    Past Surgical History:  Procedure Laterality Date  . WISDOM TOOTH EXTRACTION  2011    Family History  Problem Relation Age of Onset  . Asthma Sister   . Asthma Brother     Social History   Tobacco Use  . Smoking status: Never Smoker  . Smokeless tobacco: Never Used  Substance Use Topics  . Alcohol use: No    Comment: not since pregnant, but before that on weekends  . Drug use: No    Allergies: No Known Allergies  Medications Prior to Admission  Medication Sig Dispense Refill Last Dose  . acetaminophen  (TYLENOL) 500 MG tablet Take 1,000 mg by mouth every 6 (six) hours as needed for mild pain.   prn  . ferrous sulfate 325 (65 FE) MG tablet Take 1 tablet (325 mg total) by mouth daily. 30 tablet 3 09/05/2017 at Unknown time  . ibuprofen (ADVIL,MOTRIN) 600 MG tablet Take 1 tablet (600 mg total) by mouth every 6 (six) hours. 30 tablet 0   . Prenatal Vit-Fe Fumarate-FA (PRENATAL MULTIVITAMIN) TABS Take 1 tablet by mouth daily.    09/05/2017 at Unknown time    Review of Systems  Constitutional: Negative for chills, fatigue and fever.  Respiratory: Negative for shortness of breath.   Cardiovascular: Negative for chest pain.  Gastrointestinal: Positive for abdominal pain.  Genitourinary: Negative for difficulty urinating, dysuria, flank pain, vaginal bleeding, vaginal discharge and vaginal pain.  Musculoskeletal: Negative for back pain.  Neurological: Negative for headaches.  All other systems reviewed and are negative.  Physical Exam   Blood pressure 128/81, pulse 93, temperature 98.6 F (37 C), temperature source Oral, resp. rate 20, height 5\' 2"  (1.575 m), weight 74.7 kg, last menstrual period 02/23/2019, SpO2 98 %, unknown if currently breastfeeding.  Physical Exam  Nursing note  and vitals reviewed. Constitutional: She is oriented to person, place, and time. She appears well-developed and well-nourished.  Cardiovascular: Normal rate and normal pulses.  Respiratory: Effort normal.  GI: Soft. She exhibits no distension. There is no abdominal tenderness. There is no rebound, no guarding and no CVA tenderness.  Genitourinary:    No vaginal discharge.   Musculoskeletal: Normal range of motion.  Neurological: She is alert and oriented to person, place, and time.  Skin: Skin is warm.  Psychiatric: She has a normal mood and affect. Her behavior is normal. Judgment and thought content normal.    MAU Course/MDM  Procedures  --Patient declined second/multivitamin IV bolus due to concerns about  being away from home and her children for a prolonged period of time --Swabs collected via blind swab by RN  Patient Vitals for the past 24 hrs:  BP Temp Temp src Pulse Resp SpO2 Height Weight  03/30/19 1606 132/68 - - - 18 - - -  03/30/19 1345 128/81 98.6 F (37 C) Oral 93 20 98 %  (1.575 m) 74.7 kg    Results for orders placed or performed during the hospital encounter of 03/30/19 (from the past 24 hour(s))  Pregnancy, urine POC     Status: Abnormal   Collection Time: 03/30/19  1:58 PM  Result Value Ref Range   Preg Test, Ur POSITIVE (A) NEGATIVE  Urinalysis, Routine w reflex microscopic     Status: Abnormal   Collection Time: 03/30/19  2:03 PM  Result Value Ref Range   Color, Urine AMBER (A) YELLOW   APPearance CLOUDY (A) CLEAR   Specific Gravity, Urine 1.034 (H) 1.005 - 1.030   pH 6.0 5.0 - 8.0   Glucose, UA NEGATIVE NEGATIVE mg/dL   Hgb urine dipstick NEGATIVE NEGATIVE   Bilirubin Urine NEGATIVE NEGATIVE   Ketones, ur 80 (A) NEGATIVE mg/dL   Protein, ur 30 (A) NEGATIVE mg/dL   Nitrite NEGATIVE NEGATIVE   Leukocytes,Ua NEGATIVE NEGATIVE   RBC / HPF 0-5 0 - 5 RBC/hpf   WBC, UA 0-5 0 - 5 WBC/hpf   Bacteria, UA FEW (A) NONE SEEN   Squamous Epithelial / LPF 21-50 0 - 5   Mucus PRESENT   CBC with Differential/Platelet     Status: Abnormal   Collection Time: 03/30/19  2:37 PM  Result Value Ref Range   WBC 6.7 4.0 - 10.5 K/uL   RBC 4.35 3.87 - 5.11 MIL/uL   Hemoglobin 11.4 (L) 12.0 - 15.0 g/dL   HCT 40.9 (L) 81.1 - 91.4 %   MCV 80.9 80.0 - 100.0 fL   MCH 26.2 26.0 - 34.0 pg   MCHC 32.4 30.0 - 36.0 g/dL   RDW 78.2 95.6 - 21.3 %   Platelets 226 150 - 400 K/uL   nRBC 0.0 0.0 - 0.2 %   Neutrophils Relative % 65 %   Neutro Abs 4.4 1.7 - 7.7 K/uL   Lymphocytes Relative 26 %   Lymphs Abs 1.8 0.7 - 4.0 K/uL   Monocytes Relative 8 %   Monocytes Absolute 0.5 0.1 - 1.0 K/uL   Eosinophils Relative 1 %   Eosinophils Absolute 0.0 0.0 - 0.5 K/uL   Basophils Relative 0 %    Basophils Absolute 0.0 0.0 - 0.1 K/uL   Immature Granulocytes 0 %   Abs Immature Granulocytes 0.01 0.00 - 0.07 K/uL  Comprehensive metabolic panel     Status: Abnormal   Collection Time: 03/30/19  2:37 PM  Result Value Ref  Range   Sodium 133 (L) 135 - 145 mmol/L   Potassium 3.5 3.5 - 5.1 mmol/L   Chloride 105 98 - 111 mmol/L   CO2 22 22 - 32 mmol/L   Glucose, Bld 91 70 - 99 mg/dL   BUN 7 6 - 20 mg/dL   Creatinine, Ser 1.44 0.44 - 1.00 mg/dL   Calcium 8.5 (L) 8.9 - 10.3 mg/dL   Total Protein 6.3 (L) 6.5 - 8.1 g/dL   Albumin 3.4 (L) 3.5 - 5.0 g/dL   AST 19 15 - 41 U/L   ALT 18 0 - 44 U/L   Alkaline Phosphatase 39 38 - 126 U/L   Total Bilirubin 0.6 0.3 - 1.2 mg/dL   GFR calc non Af Amer >60 >60 mL/min   GFR calc Af Amer >60 >60 mL/min   Anion gap 6 5 - 15  hCG, quantitative, pregnancy     Status: Abnormal   Collection Time: 03/30/19  2:37 PM  Result Value Ref Range   hCG, Beta Chain, Quant, S 315,400 (H) <5 mIU/mL  Wet prep, genital     Status: Abnormal   Collection Time: 03/30/19  2:55 PM  Result Value Ref Range   Yeast Wet Prep HPF POC NONE SEEN NONE SEEN   Trich, Wet Prep NONE SEEN NONE SEEN   Clue Cells Wet Prep HPF POC NONE SEEN NONE SEEN   WBC, Wet Prep HPF POC MODERATE (A) NONE SEEN   Sperm NONE SEEN     US Ob Comp Less 14 Wks  Result Date: 03/30/2019 CLINICAL DATA:  Abdominal cramping and right lower quadrant pain. Estimated gestational age of [redacted] weeks, 0 days by LMP. EXAM: OBSTETRIC <14 WK ULTRASOUND TECHNIQUE: Transabdominal ultrasound was performed for evaluation of the gestation as well as the maternal uterus and adnexal regions. COMPARISON:  Ob ultrasound dated October 26, 2012. FINDINGS: Intrauterine gestational sac: Single Yolk sac:  Visualized. Embryo:  Visualized. Cardiac Activity: Visualized. Heart Rate: 174 bpm CRL:   20.7 mm   8 w 4 d                  [place] EDC: 11/05/2019 Subchorionic hemorrhage:  None visualized. Maternal uterus/adnexae: Unremarkable. IMPRESSION:  Single, live, intrauterine pregnancy with estimated gestational age of [redacted] weeks, 4 days. No acute abnormality. Electronically Signed   By: Obie Dredge M.D.   On: 03/30/2019 15:37    Meds ordered this encounter  Medications  . ondansetron (ZOFRAN-ODT) disintegrating tablet 4 mg  . lactated ringers bolus 1,000 mL  . multivitamins adult (INFUVITE ADULT) 10 mL in lactated ringers 1,000 mL infusion  . Prenatal MV & Min w/FA-DHA (PRENATAL ADULT GUMMY/DHA/FA) 0.4-25 MG CHEW    Sig: Chew 1 tablet by mouth daily.    Dispense:  30 tablet    Refill:  6    Order Specific Question:   Supervising Provider    Answer:   Reva Bores [[id number]]  . Doxylamine-Pyridoxine ER (BONJESTA) 20-20 MG TBCR    Sig: Take 1 tablet by mouth 2 (two) times daily at 8 am and 10 pm.    Dispense:  60 tablet    Refill:  0    Order Specific Question:   Supervising Provider    Answer:   Reva Bores [[id number]]  . metoCLOPramide (REGLAN) 10 MG tablet    Sig: Take 1 tablet (10 mg total) by mouth every 6 (six) hours. For nausea not relieved for Bonjesta and bland diet    Dispense:  30 tablet    Refill:  0    Order Specific Question:   Supervising Provider    Answer:   Reva Bores [[id number]]   Assessment and Plan  --29 y.o. [phone] with SIUP at 100w4d  --Ketonuria, s/p IV fluids x 1 Liter. Second liter declined by patient --Reviewed diet modifications to reduce emesis episodes. Rx to pharmacy --Discharge home in stable condition  F/U: Patient unsure of continuing pregnancy. Advised to schedule new ob around 11 weeks if she continues  Calvert Cantor, [place] 03/30/2019, 4:21 PM

## 2019-03-31 LAB — GC/CHLAMYDIA PROBE AMP (~~LOC~~) NOT AT ARMC
CHLAMYDIA, DNA PROBE: NEGATIVE
NEISSERIA GONORRHEA: NEGATIVE

## 2019-05-26 IMAGING — US OBSTETRIC <14 WK ULTRASOUND
1 series · 15 of 23 positions shown · non-contrast
Comparison: Ob ultrasound dated October 26, 2012.

CLINICAL DATA: Abdominal cramping and right lower quadrant pain.
Estimated gestational age of 9 weeks, 0 days by LMP.

EXAM:
OBSTETRIC <14 WK ULTRASOUND
TECHNIQUE: Transabdominal ultrasound was performed for evaluation of the
gestation as well as the maternal uterus and adnexal regions.

[Series 1: obstetric <14 wk ultrasound · 15 of 23 slices shown]
[im 1/23]
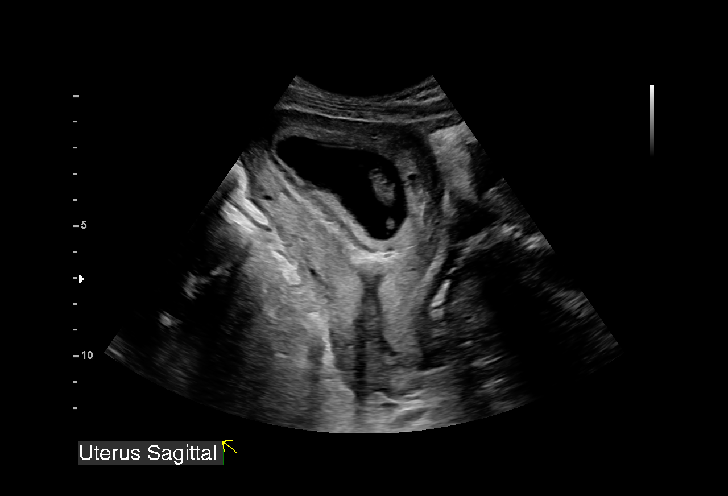
[im 3/23]
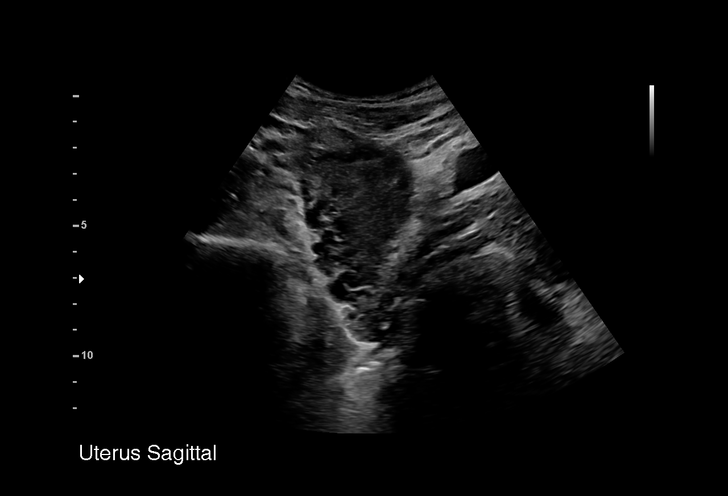
[im 4/23]
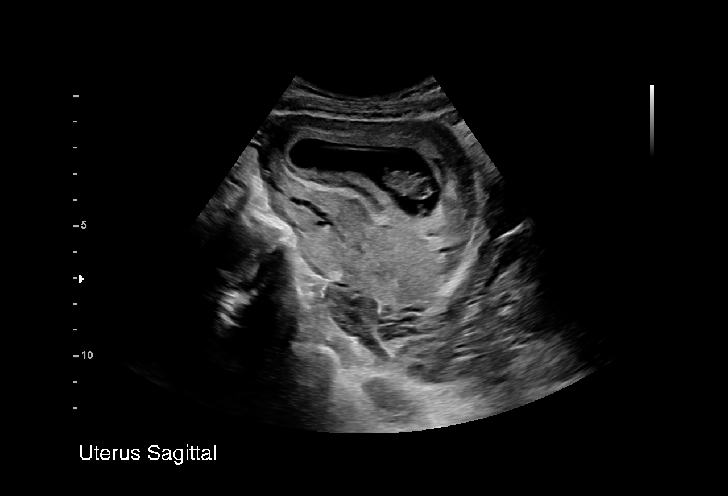
[im 6/23]
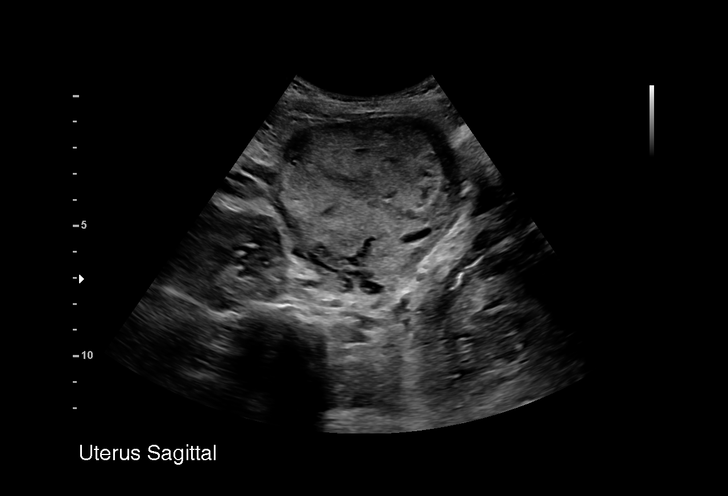
[im 7/23]
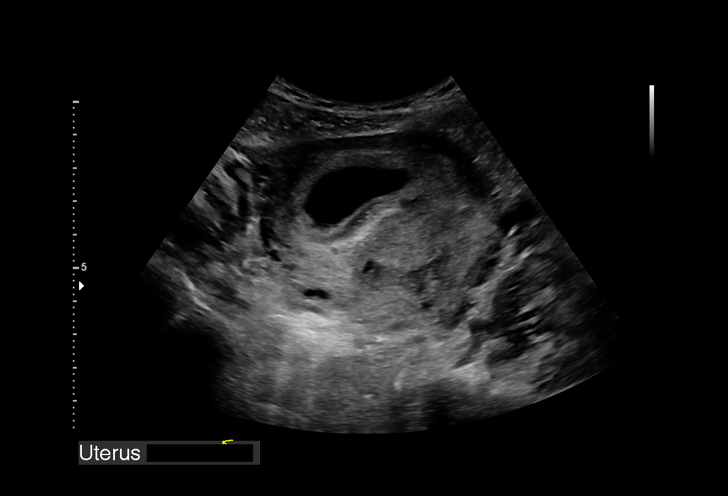
[im 9/23]
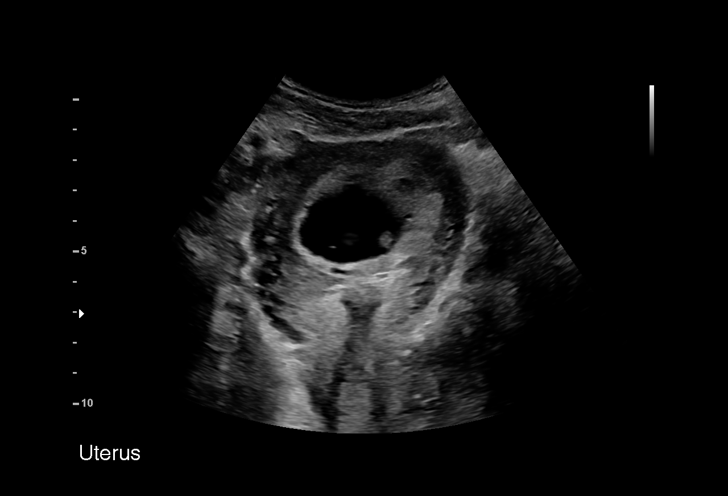
[im 10/23]
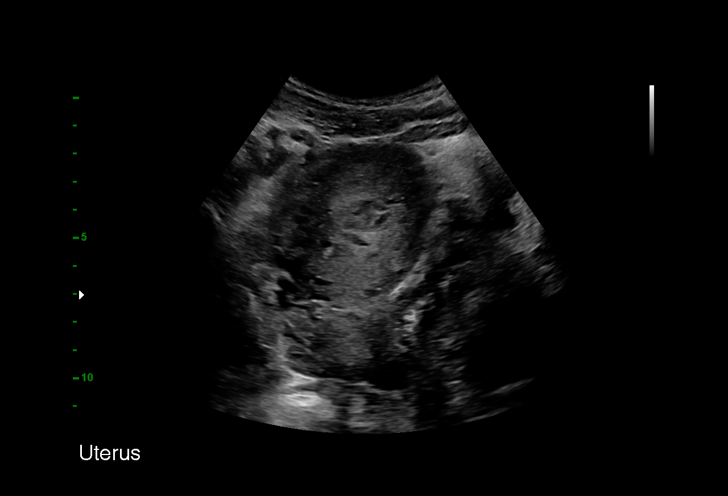
[im 12/23]
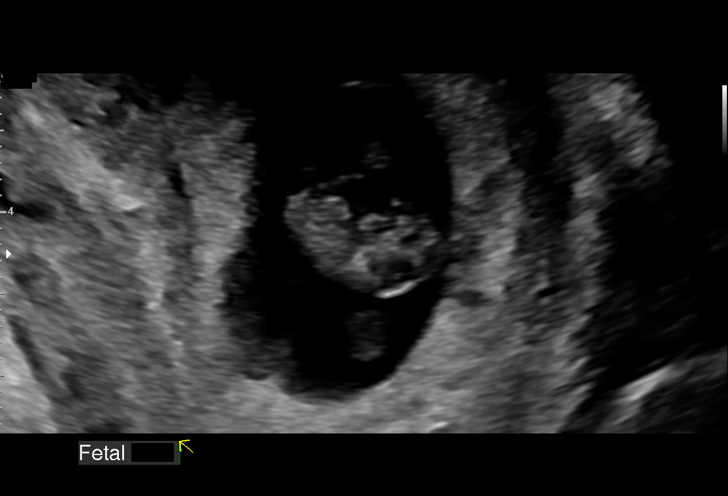
[im 14/23]
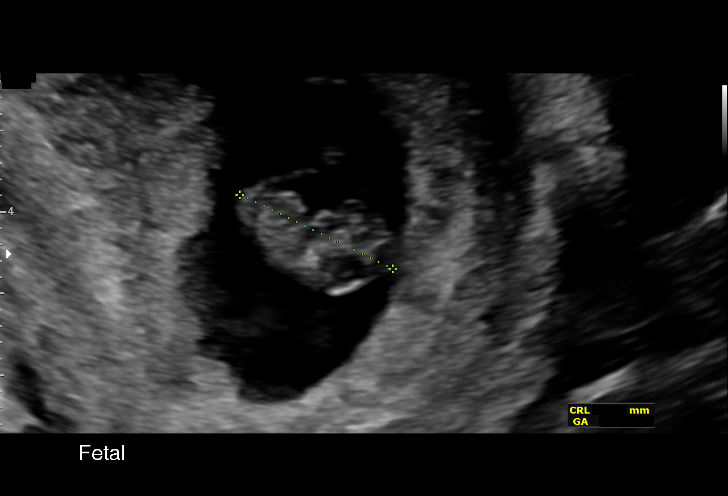
[im 15/23]
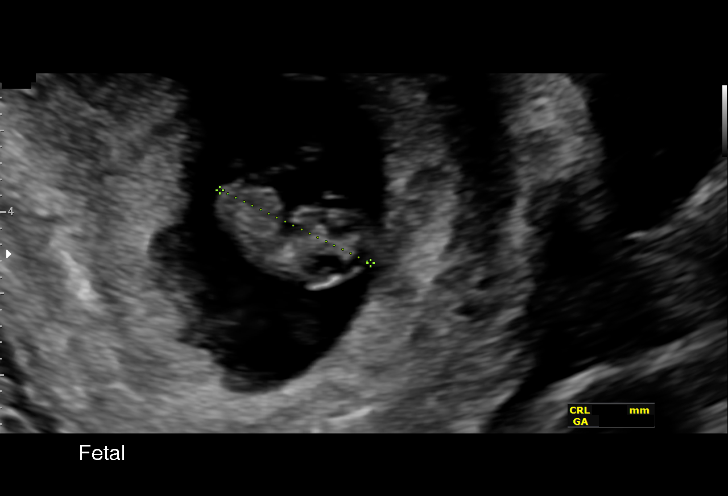
[im 17/23]
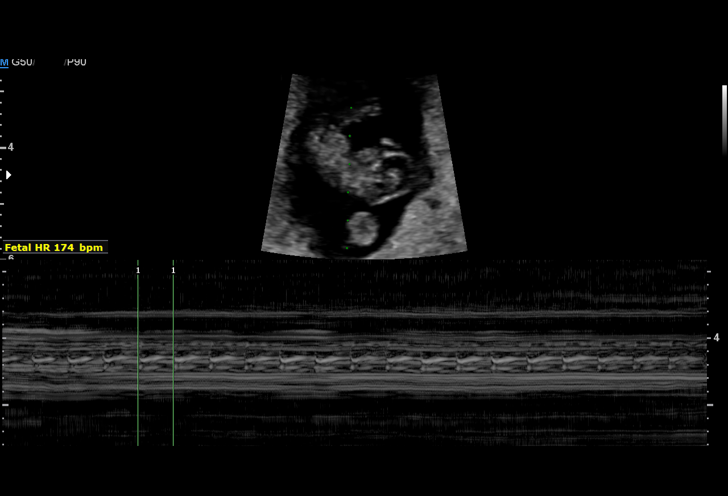
[im 18/23]
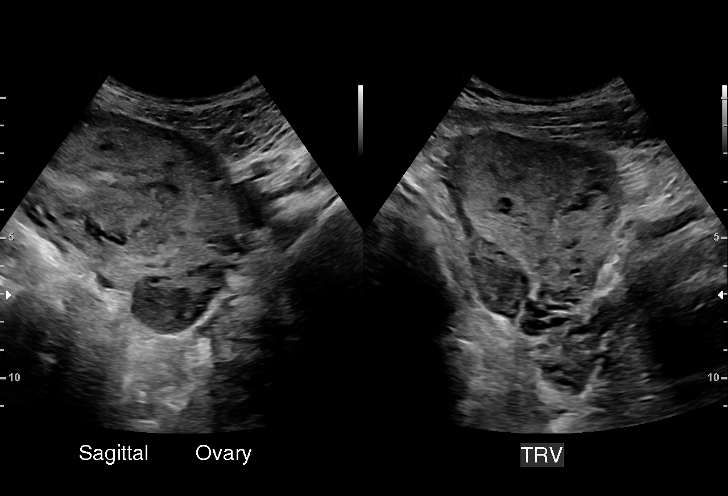
[im 20/23]
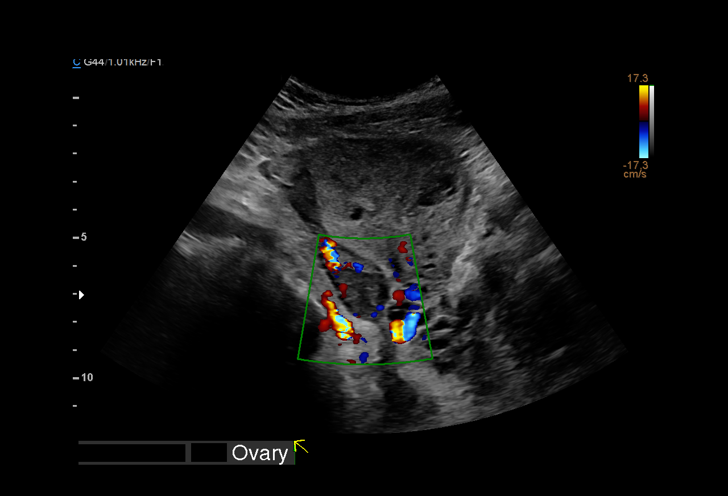
[im 21/23]
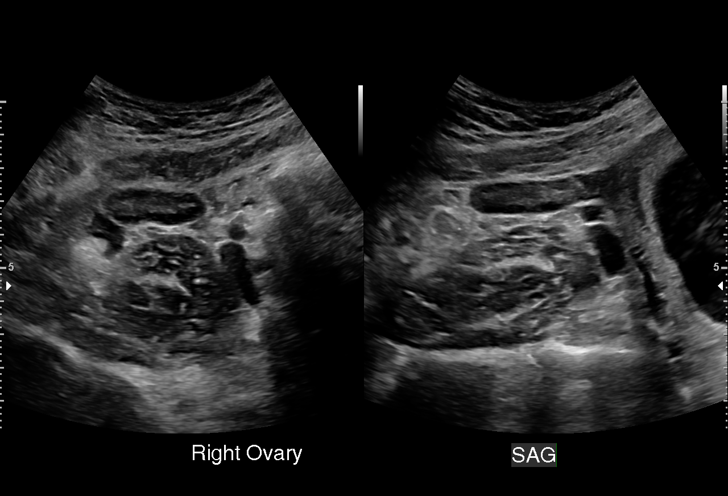
[im 23/23]
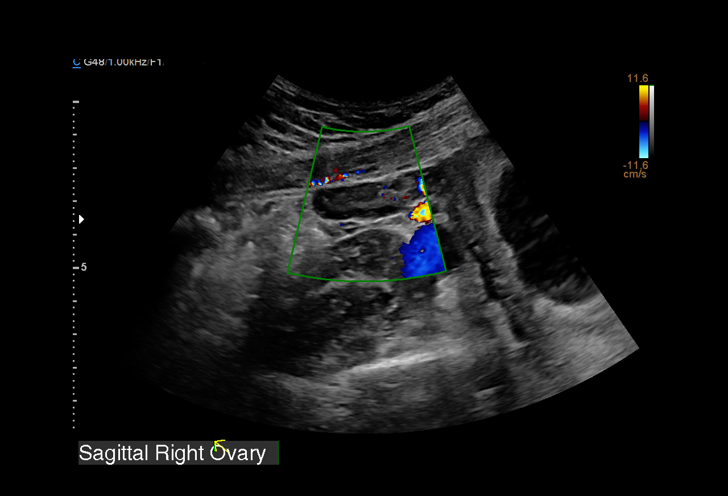

[15 of 23 positions shown; findings below may reference images not displayed]

FINDINGS: Intrauterine gestational sac: Single

Yolk sac:  Visualized.

Embryo:  Visualized.

Cardiac Activity: Visualized.

Heart Rate: 174 bpm

CRL:   20.7 mm   8 w 4 d                  US EDC: 11/05/2019

Subchorionic hemorrhage:  None visualized.

Maternal uterus/adnexae: Unremarkable.
IMPRESSION: Single, live, intrauterine pregnancy with estimated gestational age
of 8 weeks, 4 days. No acute abnormality.

## 2019-06-09 ENCOUNTER — Other Ambulatory Visit: Payer: Self-pay | Admitting: *Deleted

## 2019-06-09 ENCOUNTER — Telehealth (INDEPENDENT_AMBULATORY_CARE_PROVIDER_SITE_OTHER): Payer: Medicaid Other

## 2019-06-09 DIAGNOSIS — O0930 Supervision of pregnancy with insufficient antenatal care, unspecified trimester: Secondary | ICD-10-CM

## 2019-06-09 DIAGNOSIS — O09899 Supervision of other high risk pregnancies, unspecified trimester: Secondary | ICD-10-CM

## 2019-06-09 DIAGNOSIS — A630 Anogenital (venereal) warts: Secondary | ICD-10-CM

## 2019-06-09 NOTE — Telephone Encounter (Signed)
The patient stated she is very uncomfortable and has genital warts. The medication she was prescribed prior to wasn't working (tropical cream). She would like to try something different. She uses the [facility] [address], [place], [place] [address]. She should would like a prescription filled today please.

## 2019-06-09 NOTE — Telephone Encounter (Signed)
Returned pt's call regarding uncomfortable genital warts.  Per chart review, pt has not been seen in the clinic since 2018.  Reviewed with Dr. Juleen China and pt needs to come in and be seen for this.  Message sent to front office staff to schedule.  Pt verbalized understanding and agrees with plan.

## 2019-06-11 ENCOUNTER — Telehealth: Payer: Self-pay | Admitting: Obstetrics and Gynecology

## 2019-06-11 NOTE — Telephone Encounter (Signed)
The patient called in stating she would like to have the appointment moved to Friday when she has a sitter for the face to face visit. Informed the patient the next available is Friday. She stated she will keep the appointment as is. If she cant make she will call us later in the week.

## 2019-06-15 ENCOUNTER — Other Ambulatory Visit: Payer: Self-pay

## 2019-06-15 ENCOUNTER — Encounter: Payer: Self-pay | Admitting: Obstetrics and Gynecology

## 2019-06-15 ENCOUNTER — Other Ambulatory Visit (HOSPITAL_COMMUNITY)
Admission: RE | Admit: 2019-06-15 | Discharge: 2019-06-15 | Disposition: A | Discharge status: Home / Self Care | Payer: Medicaid Other | Source: Ambulatory Visit | Attending: Obstetrics and Gynecology | Admitting: Obstetrics and Gynecology

## 2019-06-15 ENCOUNTER — Ambulatory Visit (INDEPENDENT_AMBULATORY_CARE_PROVIDER_SITE_OTHER): Payer: Medicaid Other | Admitting: Obstetrics and Gynecology

## 2019-06-15 ENCOUNTER — Telehealth (INDEPENDENT_AMBULATORY_CARE_PROVIDER_SITE_OTHER): Payer: Medicaid Other | Admitting: *Deleted

## 2019-06-15 VITALS — BP 109/62 | HR 91 | Wt 174.2 lb

## 2019-06-15 DIAGNOSIS — O0990 Supervision of high risk pregnancy, unspecified, unspecified trimester: Secondary | ICD-10-CM | POA: Insufficient documentation

## 2019-06-15 DIAGNOSIS — O09899 Supervision of other high risk pregnancies, unspecified trimester: Secondary | ICD-10-CM | POA: Insufficient documentation

## 2019-06-15 DIAGNOSIS — Z3482 Encounter for supervision of other normal pregnancy, second trimester: Secondary | ICD-10-CM | POA: Diagnosis not present

## 2019-06-15 DIAGNOSIS — O09219 Supervision of pregnancy with history of pre-term labor, unspecified trimester: Secondary | ICD-10-CM

## 2019-06-15 DIAGNOSIS — Z3A19 19 weeks gestation of pregnancy: Secondary | ICD-10-CM

## 2019-06-15 LAB — POCT URINALYSIS DIP (DEVICE)
Bilirubin Urine: NEGATIVE
Glucose, UA: NEGATIVE mg/dL
Hgb urine dipstick: NEGATIVE
Ketones, ur: NEGATIVE mg/dL
Leukocytes,Ua: NEGATIVE
Nitrite: NEGATIVE
Protein, ur: NEGATIVE mg/dL
Specific Gravity, Urine: 1.025 (ref 1.005–1.030)
Urobilinogen, UA: 2 mg/dL — ABNORMAL HIGH (ref 0.0–1.0)
pH: 6.5 (ref 5.0–8.0)

## 2019-06-15 MED ORDER — AMBULATORY NON FORMULARY MEDICATION: 1.0000 | 0 refills | Status: DC

## 2019-06-15 NOTE — Progress Notes (Signed)
Subjective:  [name] is a 29 y.o. [id number] at 109w4d being seen today for her first OB appt. EDD by first trimester. H/O preterm delivery at 66 6/7 weeks all deliveries since full term.No chronic medical problems or medications.  She is currently monitored for the following issues for this low-risk pregnancy and has Supervision of high risk pregnancy, antepartum and History of preterm delivery, currently pregnant on their problem list.  Patient reports no complaints.  Contractions: Not present. Vag. Bleeding: None.  Movement: Present. Denies leaking of fluid.   The following portions of the patient's history were reviewed and updated as appropriate: allergies, current medications, past family history, past medical history, past social history, past surgical history and problem list. Problem list updated.  Objective:   Vitals:   06/15/19 1504  BP: 109/62  Pulse: 91  Weight: 174 lb 3.2 oz (79 kg)    Fetal Status:     Movement: Present     General:  Alert, oriented and cooperative. Patient is in no acute distress.  Skin: Skin is warm and dry. No rash noted.   Cardiovascular: Normal heart rate noted  Respiratory: Normal respiratory effort, no problems with respiration noted  Abdomen: Soft, gravid, appropriate for gestational age. Pain/Pressure: Absent     Pelvic:  Cervical exam performed        Extremities: Normal range of motion.  Edema: None  Mental Status: Normal mood and affect. Normal behavior. Normal judgment and thought content.   Urinalysis:      Assessment and Plan:  Pregnancy: [id number] at [redacted]w[redacted]d  1. Supervision of high risk pregnancy, antepartum Prenatal care and labs. Genetic testing reviewed - Genetic Screening - Hemoglobin A1c - AFP, Serum, Open Spina Bifida - Cytology - PAP( [facility])  2. History of preterm delivery, currently pregnant 17 OHP not indicated  Preterm labor symptoms and general obstetric precautions including but not limited to vaginal  bleeding, contractions, leaking of fluid and fetal movement were reviewed in detail with the patient. Please refer to After Visit Summary for other counseling recommendations.  Return in about 4 weeks (around 07/13/2019) for OB visit, virtual.   Chancy Milroy, MD

## 2019-06-15 NOTE — Progress Notes (Signed)
Agree with A & P. 

## 2019-06-15 NOTE — Progress Notes (Signed)
I connected with  Tanz[place] Lafontant on 06/15/19 at 10:02 by telephone and verified that I am speaking with the correct person using two identifiers.   I discussed the limitations, risks, security and privacy concerns of performing an evaluation and management service by telephone and virtually and the availability of in person appointments. I also discussed with the patient that there may be a patient responsible charge related to this service. The patient expressed understanding and agreed to proceed.  We were unable to connect virtually- completed visit by telephone.  I sent her [employer] app. I explained I will order her a bp cuff and she should take her bp weekly and enter into app . I explained at her new ov visit this afternoon she should wear a mask and that she will have exam, blood work, genetic testing if desired.   Vaughan Basta, RN 06/15/2019  10:03 AM

## 2019-06-18 LAB — AFP, SERUM, OPEN SPINA BIFIDA
AFP MoM: 1.17
AFP Value: 61.7 ng/mL
Gest. Age on Collection Date: 19.6 weeks
Maternal Age At EDD: 27.4 yr
OSBR Risk 1 IN: 10000
Test Results:: NEGATIVE
Weight: 174 [lb_av]

## 2019-06-18 LAB — CYTOLOGY - PAP
Chlamydia: NEGATIVE
Diagnosis: UNDETERMINED — AB
HPV: DETECTED — AB
Neisseria Gonorrhea: NEGATIVE

## 2019-06-18 LAB — HEMOGLOBIN A1C
Est. average glucose Bld gHb Est-mCnc: 103 mg/dL
Hgb A1c MFr Bld: 5.2 % (ref 4.8–5.6)

## 2019-06-19 ENCOUNTER — Other Ambulatory Visit: Payer: Self-pay

## 2019-06-19 ENCOUNTER — Other Ambulatory Visit: Payer: Self-pay | Admitting: Obstetrics and Gynecology

## 2019-06-19 ENCOUNTER — Ambulatory Visit (HOSPITAL_COMMUNITY)
Admission: RE | Admit: 2019-06-19 | Discharge: 2019-06-19 | Disposition: A | Discharge status: Home / Self Care | Payer: Medicaid Other | Source: Ambulatory Visit | Attending: Obstetrics and Gynecology | Admitting: Obstetrics and Gynecology

## 2019-06-19 DIAGNOSIS — O0930 Supervision of pregnancy with insufficient antenatal care, unspecified trimester: Secondary | ICD-10-CM | POA: Insufficient documentation

## 2019-06-19 DIAGNOSIS — Z3A20 20 weeks gestation of pregnancy: Secondary | ICD-10-CM | POA: Diagnosis not present

## 2019-06-19 DIAGNOSIS — O09899 Supervision of other high risk pregnancies, unspecified trimester: Secondary | ICD-10-CM

## 2019-06-19 DIAGNOSIS — O359XX0 Maternal care for (suspected) fetal abnormality and damage, unspecified, not applicable or unspecified: Secondary | ICD-10-CM | POA: Diagnosis not present

## 2019-06-19 DIAGNOSIS — Z363 Encounter for antenatal screening for malformations: Secondary | ICD-10-CM | POA: Diagnosis not present

## 2019-06-19 DIAGNOSIS — O09219 Supervision of pregnancy with history of pre-term labor, unspecified trimester: Secondary | ICD-10-CM | POA: Insufficient documentation

## 2019-06-20 DIAGNOSIS — O09219 Supervision of pregnancy with history of pre-term labor, unspecified trimester: Secondary | ICD-10-CM | POA: Diagnosis not present

## 2019-06-20 DIAGNOSIS — O0990 Supervision of high risk pregnancy, unspecified, unspecified trimester: Secondary | ICD-10-CM | POA: Diagnosis not present

## 2019-06-23 ENCOUNTER — Encounter: Payer: Self-pay | Admitting: *Deleted

## 2019-07-13 ENCOUNTER — Other Ambulatory Visit: Payer: Self-pay

## 2019-07-13 ENCOUNTER — Encounter: Payer: Self-pay | Admitting: Obstetrics & Gynecology

## 2019-07-13 ENCOUNTER — Telehealth: Payer: Self-pay | Admitting: Obstetrics & Gynecology

## 2019-07-13 ENCOUNTER — Telehealth: Payer: Medicaid Other | Admitting: Obstetrics & Gynecology

## 2019-07-13 NOTE — Progress Notes (Signed)
3419 Attempted to contact pt to start virtual appointment. LVM regarding attempts and encouraged pt to give the office a call.

## 2019-07-13 NOTE — Progress Notes (Signed)
0911 I called [place] for her virtual appointment and left message we were trying to reach her for her virtual visit- since we have not reached her - she will need to reschedule.  Vaughan Basta,  RN

## 2019-07-13 NOTE — Telephone Encounter (Signed)
Attempted to call patient about her missed ob appointment on 7/16. No answer, left voicemail for patient to give the office a call back to be rescheduled. No show letter mailed.

## 2019-07-13 NOTE — Progress Notes (Signed)
0843  I called [place] to begin her virtual appointment. I left a message I was calling for her virtual appointment and will call back closer to her appointment time.  Natasja Niday,RN

## 2019-07-29 ENCOUNTER — Other Ambulatory Visit: Payer: Self-pay

## 2019-07-29 ENCOUNTER — Ambulatory Visit (INDEPENDENT_AMBULATORY_CARE_PROVIDER_SITE_OTHER): Payer: Medicaid Other | Admitting: Family Medicine

## 2019-07-29 ENCOUNTER — Other Ambulatory Visit (HOSPITAL_COMMUNITY)
Admission: RE | Admit: 2019-07-29 | Discharge: 2019-07-29 | Disposition: A | Discharge status: Home / Self Care | Payer: Medicaid Other | Source: Ambulatory Visit | Attending: Family Medicine | Admitting: Family Medicine

## 2019-07-29 VITALS — BP 105/68 | HR 105 | Temp 98.8°F | Wt 262.7 lb

## 2019-07-29 DIAGNOSIS — O0990 Supervision of high risk pregnancy, unspecified, unspecified trimester: Secondary | ICD-10-CM

## 2019-07-29 DIAGNOSIS — R87810 Cervical high risk human papillomavirus (HPV) DNA test positive: Secondary | ICD-10-CM | POA: Insufficient documentation

## 2019-07-29 DIAGNOSIS — Z3A25 25 weeks gestation of pregnancy: Secondary | ICD-10-CM

## 2019-07-29 DIAGNOSIS — R87610 Atypical squamous cells of undetermined significance on cytologic smear of cervix (ASC-US): Secondary | ICD-10-CM | POA: Diagnosis not present

## 2019-07-29 DIAGNOSIS — O09212 Supervision of pregnancy with history of pre-term labor, second trimester: Secondary | ICD-10-CM | POA: Diagnosis not present

## 2019-07-29 DIAGNOSIS — O0992 Supervision of high risk pregnancy, unspecified, second trimester: Secondary | ICD-10-CM

## 2019-07-29 DIAGNOSIS — O09899 Supervision of other high risk pregnancies, unspecified trimester: Secondary | ICD-10-CM

## 2019-07-29 NOTE — Progress Notes (Signed)
   PRENATAL VISIT NOTE  Subjective:  [name] is a 29 y.o. [phone] at [redacted]w[redacted]d being seen today for ongoing prenatal care.  She is currently monitored for the following issues for this low-risk pregnancy and has Supervision of high risk pregnancy, antepartum and History of preterm delivery, currently pregnant on their problem list.  Patient reports no complaints.  Contractions: Irritability. Vag. Bleeding: None.  Movement: Present. Denies leaking of fluid.   The following portions of the patient's history were reviewed and updated as appropriate: allergies, current medications, past family history, past medical history, past social history, past surgical history and problem list.   Objective:   Vitals:   07/29/19 1350  BP: 105/68  Pulse: (!) 105  Temp: 98.8 F (37.1 C)  Weight: 262 lb 11.2 oz (119.2 kg)    Fetal Status:     Movement: Present     General:  Alert, oriented and cooperative. Patient is in no acute distress.  Skin: Skin is warm and dry. No rash noted.   Cardiovascular: Normal heart rate noted  Respiratory: Normal respiratory effort, no problems with respiration noted  Abdomen: Soft, gravid, appropriate for gestational age.  Pain/Pressure: Present     Pelvic: Cervical exam deferred        Extremities: Normal range of motion.  Edema: None  Mental Status: Normal mood and affect. Normal behavior. Normal judgment and thought content.   Procedure: Pt. Is an 29 y.o. [id number] who presents for follow-up following an abnormal pap smear which showed ASCUS with + HR HPV.   Patient given informed consent, signed copy in the chart, time out was performed.  Placed in lithotomy position. Cervix viewed with speculum and colposcope after application of acetic acid.   Colposcopy adequate?  yes Acetowhite lesions? no Punctation? no Mosaicism?  no Abnormal vasculature?  no Biopsies? 11 and 7 o'clock ECC? no  COMMENTS:  Patient was given post procedure instructions.  She will  get results through MyChart  Assessment and Plan:  Pregnancy: G4P2103 at [redacted]w[redacted]d 1. Supervision of high risk pregnancy, antepartum Next visit, no New OB labs done - Glucose Tolerance, 2 Hours w/1 Hour; Future - Obstetric Panel, Including HIV; Future - Culture, OB Urine; Future  2. History of preterm delivery, currently pregnant   3. ASCUS with positive high risk HPV cervical Biopsy today - Surgical pathology( [place]/ [facility])  Preterm labor symptoms and general obstetric precautions including but not limited to vaginal bleeding, contractions, leaking of fluid and fetal movement were reviewed in detail with the patient. Please refer to After Visit Summary for other counseling recommendations.   Return in about 2 weeks (around 08/12/2019) for in person, [facility] , 28 wk labs + OGT and New OB labs.  Future Appointments  Date Time Provider Department Center  08/17/2019  8:35 AM Donnamae Jude, MD [facility] [place]  08/17/2019  9:30 AM [facility]  08/31/2019  1:55 PM Truett Mainland, DO [facility]    Donnamae Jude, MD

## 2019-07-31 ENCOUNTER — Telehealth: Payer: Self-pay | Admitting: Obstetrics & Gynecology

## 2019-07-31 DIAGNOSIS — A630 Anogenital (venereal) warts: Secondary | ICD-10-CM

## 2019-07-31 NOTE — Telephone Encounter (Signed)
Called the patient to inform of the upcoming appointment, also informed of how to log into the mychart app to attend. Notified the patient if the provider does not log in within 15 minutes of the appointment please call our office so that we can ensure there are no issues with the connection. The patient verbalized understanding.

## 2019-07-31 NOTE — Telephone Encounter (Signed)
The patient stated she came in last Wednesday and was told by Dr. Kennon Rounds she would have a prescription filled for  Aldara tropical cream. [facility] visits [facility] [address], [place].

## 2019-08-03 ENCOUNTER — Telehealth: Payer: Self-pay | Admitting: Advanced Practice Midwife

## 2019-08-03 NOTE — Telephone Encounter (Signed)
The patient called in stating she called last week regarding medication that was supposed to be sent over to the pharmacy. She does not remember the name of the medication. Would like a call back from a nurse.

## 2019-08-04 ENCOUNTER — Encounter: Payer: Self-pay | Admitting: *Deleted

## 2019-08-04 MED ORDER — IMIQUIMOD 5 % EX CREA: TOPICAL_CREAM | CUTANEOUS | 2 refills | Status: DC

## 2019-08-04 NOTE — Addendum Note (Signed)
Addended by: Donnamae Jude on: 08/04/2019 04:52 PM   Modules accepted: Orders

## 2019-08-04 NOTE — Telephone Encounter (Signed)
Pt notified via MyChart message that her request for Aldara cream has been routed to Dr. Kennon Rounds for review.

## 2019-08-07 ENCOUNTER — Ambulatory Visit: Payer: Medicaid Other

## 2019-08-11 ENCOUNTER — Encounter: Payer: Self-pay | Admitting: *Deleted

## 2019-08-17 ENCOUNTER — Telehealth: Payer: Self-pay | Admitting: Family Medicine

## 2019-08-17 ENCOUNTER — Encounter: Payer: Medicaid Other | Admitting: Family Medicine

## 2019-08-17 ENCOUNTER — Other Ambulatory Visit: Payer: Medicaid Other

## 2019-08-17 ENCOUNTER — Encounter: Payer: Self-pay | Admitting: Family Medicine

## 2019-08-17 NOTE — Telephone Encounter (Signed)
[name] called to say she was not able to make her appointment due to needing to get her kids ready for school. She will be coming in this week to do her 2 hour GTT.

## 2019-08-17 NOTE — Progress Notes (Signed)
Patient did not keep appointment today. She will be called to reschedule.  

## 2019-08-18 ENCOUNTER — Telehealth: Payer: Self-pay | Admitting: Family Medicine

## 2019-08-18 NOTE — Telephone Encounter (Signed)
Attempted to call patient about her appointment on 8/19 @ 9:10. No answer, left voicemail instructing patient to wear a face mask for the entire appointment and no visitors are allowed during the visit. Patient instructed not to attend the appointment if she was any symptoms. Symptom list and office number left. Patient instructed to come fasting.

## 2019-08-19 ENCOUNTER — Other Ambulatory Visit: Payer: Self-pay

## 2019-08-19 ENCOUNTER — Other Ambulatory Visit: Payer: Medicaid Other

## 2019-08-19 DIAGNOSIS — O0990 Supervision of high risk pregnancy, unspecified, unspecified trimester: Secondary | ICD-10-CM | POA: Diagnosis not present

## 2019-08-19 DIAGNOSIS — O99013 Anemia complicating pregnancy, third trimester: Secondary | ICD-10-CM

## 2019-08-20 LAB — OBSTETRIC PANEL, INCLUDING HIV
Antibody Screen: NEGATIVE
Basophils Absolute: 0 10*3/uL (ref 0.0–0.2)
Basos: 0 %
EOS (ABSOLUTE): 0 10*3/uL (ref 0.0–0.4)
Eos: 0 %
HIV Screen 4th Generation wRfx: NONREACTIVE
Hematocrit: 25 % — ABNORMAL LOW (ref 34.0–46.6)
Hemoglobin: 8.5 g/dL — ABNORMAL LOW (ref 11.1–15.9)
Hepatitis B Surface Ag: NEGATIVE
Immature Grans (Abs): 0.1 10*3/uL (ref 0.0–0.1)
Immature Granulocytes: 2 %
Lymphocytes Absolute: 2.2 10*3/uL (ref 0.7–3.1)
Lymphs: 33 %
MCH: 28.2 pg (ref 26.6–33.0)
MCHC: 34 g/dL (ref 31.5–35.7)
MCV: 83 fL (ref 79–97)
Monocytes Absolute: 0.5 10*3/uL (ref 0.1–0.9)
Monocytes: 8 %
Neutrophils Absolute: 3.8 10*3/uL (ref 1.4–7.0)
Neutrophils: 57 %
Platelets: 201 10*3/uL (ref 150–450)
RBC: 3.01 x10E6/uL — ABNORMAL LOW (ref 3.77–5.28)
RDW: 12.5 % (ref 11.7–15.4)
RPR Ser Ql: NONREACTIVE
Rh Factor: POSITIVE
Rubella Antibodies, IGG: 1.85 index (ref >0.99)
WBC: 6.7 10*3/uL (ref 3.4–10.8)

## 2019-08-20 LAB — GLUCOSE TOLERANCE, 2 HOURS W/ 1HR
Glucose, 1 hour: 121 mg/dL (ref 65–179)
Glucose, 2 hour: 76 mg/dL (ref 65–152)
Glucose, Fasting: 80 mg/dL (ref 65–91)

## 2019-08-21 ENCOUNTER — Telehealth: Payer: Self-pay

## 2019-08-21 DIAGNOSIS — O99019 Anemia complicating pregnancy, unspecified trimester: Secondary | ICD-10-CM

## 2019-08-21 HISTORY — DX: Anemia complicating pregnancy, unspecified trimester: O99.019

## 2019-08-21 LAB — URINE CULTURE, OB REFLEX: Organism ID, Bacteria: NO GROWTH

## 2019-08-21 LAB — CULTURE, OB URINE

## 2019-08-21 NOTE — Addendum Note (Signed)
Addended by: Donnamae Jude on: 08/21/2019 08:11 AM   Modules accepted: Orders

## 2019-08-21 NOTE — Telephone Encounter (Addendum)
-----   Message from Donnamae Jude, MD sent at 08/21/2019  8:13 AM EDT ----- Needs Feraheme--orders in, patient informed, please schedule.  Scheduled Feraheme for Aug 25th @ 1000.  Pt notified of appt and that she will be able to get the information that she needs in regards to location in [place].  Pt verbalized understanding.

## 2019-08-25 ENCOUNTER — Ambulatory Visit (HOSPITAL_COMMUNITY)
Admission: RE | Admit: 2019-08-25 | Discharge: 2019-08-25 | Disposition: A | Discharge status: Home / Self Care | Payer: Medicaid Other | Source: Ambulatory Visit | Attending: Family Medicine | Admitting: Family Medicine

## 2019-08-25 ENCOUNTER — Other Ambulatory Visit: Payer: Self-pay

## 2019-08-25 DIAGNOSIS — Z3A00 Weeks of gestation of pregnancy not specified: Secondary | ICD-10-CM | POA: Diagnosis not present

## 2019-08-25 DIAGNOSIS — D649 Anemia, unspecified: Secondary | ICD-10-CM | POA: Diagnosis not present

## 2019-08-25 DIAGNOSIS — O99019 Anemia complicating pregnancy, unspecified trimester: Secondary | ICD-10-CM | POA: Insufficient documentation

## 2019-08-25 MED ORDER — SODIUM CHLORIDE 0.9 % IV SOLN
510.0000 mg | INTRAVENOUS | Status: DC
  Administered 2019-08-25: 510 mg via INTRAVENOUS
  Filled 2019-08-25: qty 17

## 2019-08-25 NOTE — Discharge Instructions (Signed)

## 2019-08-28 ENCOUNTER — Telehealth: Payer: Self-pay | Admitting: Family Medicine

## 2019-08-28 NOTE — Telephone Encounter (Signed)
Spoke with patient about her appointment on 8/31 @ 1:55. Patient instructed that the appointment is a mychart visit. Patient instructed to download the mychart app if not already done so. Patient verbalized she has the app downloaded

## 2019-08-31 ENCOUNTER — Telehealth (INDEPENDENT_AMBULATORY_CARE_PROVIDER_SITE_OTHER): Payer: Medicaid Other | Admitting: Family Medicine

## 2019-08-31 ENCOUNTER — Other Ambulatory Visit: Payer: Self-pay

## 2019-08-31 DIAGNOSIS — Z3A30 30 weeks gestation of pregnancy: Secondary | ICD-10-CM | POA: Diagnosis not present

## 2019-08-31 DIAGNOSIS — O0993 Supervision of high risk pregnancy, unspecified, third trimester: Secondary | ICD-10-CM | POA: Diagnosis not present

## 2019-08-31 DIAGNOSIS — O09899 Supervision of other high risk pregnancies, unspecified trimester: Secondary | ICD-10-CM

## 2019-08-31 DIAGNOSIS — O09213 Supervision of pregnancy with history of pre-term labor, third trimester: Secondary | ICD-10-CM

## 2019-08-31 DIAGNOSIS — O0990 Supervision of high risk pregnancy, unspecified, unspecified trimester: Secondary | ICD-10-CM

## 2019-08-31 NOTE — Progress Notes (Signed)
   TELEHEALTH VIRTUAL OBSTETRICS VISIT ENCOUNTER NOTE  I connected with [name] on 08/31/19 at  1:55 PM EDT by telephone at home and verified that I am speaking with the correct person using two identifiers.   I discussed the limitations, risks, security and privacy concerns of performing an evaluation and management service by telephone and the availability of in person appointments. I also discussed with the patient that there may be a patient responsible charge related to this service. The patient expressed understanding and agreed to proceed.  Subjective:  [name] is a 29 y.o. [id number] at [redacted]w[redacted]d being followed for ongoing prenatal care.  She is currently monitored for the following issues for this high-risk pregnancy and has Supervision of high risk pregnancy, antepartum; History of preterm delivery, currently pregnant; and Anemia in pregnancy on their problem list.  Patient reports no complaints. Reports fetal movement. Denies any contractions, bleeding or leaking of fluid.   The following portions of the patient's history were reviewed and updated as appropriate: allergies, current medications, past family history, past medical history, past social history, past surgical history and problem list.   Objective:   General:  Alert, oriented and cooperative.   Mental Status: Normal mood and affect perceived. Normal judgment and thought content.  Rest of physical exam deferred due to type of encounter  Assessment and Plan:  Pregnancy: Q3E0923 at [redacted]w[redacted]d 1. Supervision of high risk pregnancy, antepartum Good fetal movement  2. History of preterm delivery, currently pregnant No signs or symptoms of PTL  Preterm labor symptoms and general obstetric precautions including but not limited to vaginal bleeding, contractions, leaking of fluid and fetal movement were reviewed in detail with the patient.  I discussed the assessment and treatment plan with the patient. The patient was  provided an opportunity to ask questions and all were answered. The patient agreed with the plan and demonstrated an understanding of the instructions. The patient was advised to call back or seek an in-person office evaluation/go to MAU at [facility] for any urgent or concerning symptoms. Please refer to After Visit Summary for other counseling recommendations.   I provided 13 minutes of non-face-to-face time during this encounter.  Return in about 2 weeks (around 09/14/2019) for HR OB f/u, Virtual.  Future Appointments  Date Time Provider [place]  09/01/2019 11:00 AM [facility] ROOM 1 [facility] None    [place] for [employer], [place]

## 2019-09-01 ENCOUNTER — Other Ambulatory Visit: Payer: Self-pay

## 2019-09-01 ENCOUNTER — Ambulatory Visit (HOSPITAL_COMMUNITY)
Admission: RE | Admit: 2019-09-01 | Discharge: 2019-09-01 | Disposition: A | Discharge status: Home / Self Care | Payer: Medicaid Other | Source: Ambulatory Visit | Attending: Family Medicine | Admitting: Family Medicine

## 2019-09-01 DIAGNOSIS — O99019 Anemia complicating pregnancy, unspecified trimester: Secondary | ICD-10-CM | POA: Diagnosis present

## 2019-09-01 DIAGNOSIS — O0993 Supervision of high risk pregnancy, unspecified, third trimester: Secondary | ICD-10-CM | POA: Insufficient documentation

## 2019-09-01 DIAGNOSIS — O99013 Anemia complicating pregnancy, third trimester: Secondary | ICD-10-CM | POA: Diagnosis not present

## 2019-09-01 MED ORDER — SODIUM CHLORIDE 0.9 % IV SOLN
510.0000 mg | INTRAVENOUS | Status: DC
  Administered 2019-09-01: 510 mg via INTRAVENOUS
  Filled 2019-09-01: qty 17

## 2019-09-11 ENCOUNTER — Inpatient Hospital Stay (HOSPITAL_COMMUNITY)
Admission: AD | Admit: 2019-09-11 | Discharge: 2019-09-11 | Disposition: A | Discharge status: Home / Self Care | Payer: Medicaid Other | Attending: Obstetrics & Gynecology | Admitting: Obstetrics & Gynecology

## 2019-09-11 ENCOUNTER — Other Ambulatory Visit: Payer: Self-pay

## 2019-09-11 ENCOUNTER — Encounter (HOSPITAL_COMMUNITY): Payer: Self-pay

## 2019-09-11 DIAGNOSIS — O09899 Supervision of other high risk pregnancies, unspecified trimester: Secondary | ICD-10-CM

## 2019-09-11 DIAGNOSIS — R102 Pelvic and perineal pain: Secondary | ICD-10-CM | POA: Diagnosis not present

## 2019-09-11 DIAGNOSIS — M7918 Myalgia, other site: Secondary | ICD-10-CM | POA: Diagnosis not present

## 2019-09-11 DIAGNOSIS — Z3A32 32 weeks gestation of pregnancy: Secondary | ICD-10-CM | POA: Insufficient documentation

## 2019-09-11 DIAGNOSIS — O0990 Supervision of high risk pregnancy, unspecified, unspecified trimester: Secondary | ICD-10-CM

## 2019-09-11 DIAGNOSIS — O9989 Other specified diseases and conditions complicating pregnancy, childbirth and the puerperium: Secondary | ICD-10-CM | POA: Diagnosis not present

## 2019-09-11 DIAGNOSIS — M79652 Pain in left thigh: Secondary | ICD-10-CM | POA: Diagnosis not present

## 2019-09-11 DIAGNOSIS — N949 Unspecified condition associated with female genital organs and menstrual cycle: Secondary | ICD-10-CM | POA: Diagnosis not present

## 2019-09-11 DIAGNOSIS — O26893 Other specified pregnancy related conditions, third trimester: Secondary | ICD-10-CM | POA: Diagnosis not present

## 2019-09-11 DIAGNOSIS — R1032 Left lower quadrant pain: Secondary | ICD-10-CM | POA: Diagnosis not present

## 2019-09-11 LAB — URINALYSIS, ROUTINE W REFLEX MICROSCOPIC
Bacteria, UA: NONE SEEN
Bilirubin Urine: NEGATIVE
Glucose, UA: NEGATIVE mg/dL
Hgb urine dipstick: NEGATIVE
Ketones, ur: 20 mg/dL — AB
Leukocytes,Ua: NEGATIVE
Nitrite: NEGATIVE
Protein, ur: 30 mg/dL — AB
Specific Gravity, Urine: 1.026 (ref 1.005–1.030)
pH: 6 (ref 5.0–8.0)

## 2019-09-11 MED ORDER — CYCLOBENZAPRINE HCL 10 MG PO TABS: 5.0000 mg | ORAL_TABLET | Freq: Three times a day (TID) | ORAL | 0 refills | Status: DC | PRN

## 2019-09-11 MED ORDER — COMFORT FIT MATERNITY SUPP MED MISC: 1.0000 | Freq: Every day | 0 refills | Status: DC

## 2019-09-11 NOTE — MAU Provider Note (Signed)
Chief Complaint:  Leg Pain   None     HPI: [name] is a 29 y.o. [phone] at 37w1dwho presents to maternity admissions reporting left lower abdomen, left groin, left upper thigh pain.  She reports the pain started 2-3 days ago. It is sharp sudden pain and then an aching pain after the sharp intermittent pain.  The pain is worse with walking and getting out of bed. She has tried Tylenol but it is not helping today. She feels normal fetal movement. There are no contractions.  She has not tried other treatments.  HPI  Past Medical History: Past Medical History:  Diagnosis Date  . Anemia   . Depression    Was on Zoloft  . Preterm labor     Past obstetric history: OB History  Gravida Para Term Preterm AB Living  '4 3 2 1 ' 0 3  SAB TAB Ectopic Multiple Live Births  0 0 0 0 3    # Outcome Date GA Lbr Len/2nd Weight Sex Delivery Anes PTL Lv  4 Current           3 Term 09/06/17 368w2d6:32 / 00:06 3210 g F Vag-Spont EPI  LIV     Birth Comments: no complications  2 Term 0983/15/17971w0d:00 / 00:21 3209 g F Vag-Spont EPI  LIV     Birth Comments: no complications  1 Preterm 05/19/13 36w68w6d2:19 2580 g F Vag-Spont EPI  LIV    Past Surgical History: Past Surgical History:  Procedure Laterality Date  . WISDOM TOOTH EXTRACTION  2011    Family History: Family History  Problem Relation Age of Onset  . Asthma Sister   . Asthma Brother     Social History: Social History   Tobacco Use  . Smoking status: Never Smoker  . Smokeless tobacco: Never Used  Substance Use Topics  . Alcohol use: No    Comment: not since pregnant, but before that on weekends  . Drug use: No    Allergies: No Known Allergies  Meds:  Medications Prior to Admission  Medication Sig Dispense Refill Last Dose  . imiquimod (ALDARA) 5 % cream Apply topically 3 (three) times a week. 12 each 2 Past Week at Unknown time  . Prenatal MV & Min w/FA-DHA (PRENATAL ADULT GUMMY/DHA/FA) 0.4-25 MG CHEW Chew 1  tablet by mouth daily. 30 tablet 6 09/11/2019 at Unknown time  . acetaminophen (TYLENOL) 500 MG tablet Take 1,000 mg by mouth every 6 (six) hours as needed for mild pain.     . AMMarland KitchenULATORY NON FORMULARY MEDICATION 1 Device by Other route once a week. Medication Name: Blood Pressure Cuff Monitored regularly at home ICD 10 O09.90 1 kit 0   . Doxylamine-Pyridoxine ER (BONJESTA) 20-20 MG TBCR Take 1 tablet by mouth 2 (two) times daily at 8 am and 10 pm. (Patient not taking: Reported on 06/15/2019) 60 tablet 0   . ferrous sulfate 325 (65 FE) MG tablet Take 1 tablet (325 mg total) by mouth daily. 30 tablet 3     ROS:  Review of Systems  Constitutional: Negative for chills, fatigue and fever.  Eyes: Negative for visual disturbance.  Respiratory: Negative for shortness of breath.   Cardiovascular: Negative for chest pain.  Gastrointestinal: Negative for abdominal pain, nausea and vomiting.  Genitourinary: Negative for difficulty urinating, dysuria, flank pain, pelvic pain, vaginal bleeding, vaginal discharge and vaginal pain.  Neurological: Negative for dizziness and headaches.  Psychiatric/Behavioral: Negative.  I have reviewed patient's Past Medical Hx, Surgical Hx, Family Hx, Social Hx, medications and allergies.   Physical Exam   Patient Vitals for the past 24 hrs:  BP Temp Temp src Pulse Resp Height Weight  09/11/19 2133 118/69 98 F (36.7 C) Oral 87 16 - -  09/11/19 2013 124/61 99.2 F (37.3 C) Oral 99 16 - -  09/11/19 1946 114/67 - - (!) 103 - - -  09/11/19 1944 - 98.5 F (36.9 C) - - 18 '5\' 2"'  (1.575 m) 81.6 kg   Constitutional: Well-developed, well-nourished female in no acute distress.  Cardiovascular: normal rate Respiratory: normal effort GI: Abd soft, non-tender, gravid appropriate for gestational age.  MS: Extremities nontender, no edema, normal ROM Neurologic: Alert and oriented x 4.  GU: Neg CVAT.   Dilation: Closed Effacement (%): 0 Cervical Position:  Posterior Exam by:: Leftwich-Kirby,CNM  FHT:  Baseline 150 , moderate variability, accelerations present, no decelerations Contractions: None on toco or to palpation   Labs: Results for orders placed or performed during the hospital encounter of 09/11/19 (from the past 24 hour(s))  Urinalysis, Routine w reflex microscopic     Status: Abnormal   Collection Time: 09/11/19  8:44 PM  Result Value Ref Range   Color, Urine YELLOW YELLOW   APPearance HAZY (A) CLEAR   Specific Gravity, Urine 1.026 1.005 - 1.030   pH 6.0 5.0 - 8.0   Glucose, UA NEGATIVE NEGATIVE mg/dL   Hgb urine dipstick NEGATIVE NEGATIVE   Bilirubin Urine NEGATIVE NEGATIVE   Ketones, ur 20 (A) NEGATIVE mg/dL   Protein, ur 30 (A) NEGATIVE mg/dL   Nitrite NEGATIVE NEGATIVE   Leukocytes,Ua NEGATIVE NEGATIVE   RBC / HPF 0-5 0 - 5 RBC/hpf   WBC, UA 0-5 0 - 5 WBC/hpf   Bacteria, UA NONE SEEN NONE SEEN   Squamous Epithelial / LPF 0-5 0 - 5   Mucus PRESENT    O/Positive/-- (08/19 1000)  Imaging:  No results found.  MAU Course/MDM: Orders Placed This Encounter  Procedures  . Urinalysis, Routine w reflex microscopic  . Discharge patient    Meds ordered this encounter  Medications  . cyclobenzaprine (FLEXERIL) 10 MG tablet    Sig: Take 0.5-1 tablets (5-10 mg total) by mouth 3 (three) times daily as needed for muscle spasms.    Dispense:  20 tablet    Refill:  0    Order Specific Question:   Supervising Provider    Answer:   Elonda Husky, LUTHER H [[id number]]  . Elastic Bandages & Supports (COMFORT FIT MATERNITY SUPP MED) MISC    Sig: 1 Device by Does not apply route daily.    Dispense:  1 each    Refill:  0    Order Specific Question:   Supervising Provider    Answer:   Tania Ade H [[id number]]     NST reviewed and reactive Pain most c/w round ligament/ musculoskeletal pain. No evidence of preterm labor, no acute abdomen.  Pain with movement/walking. Rest/ice/heat/warm bath/Tylenol. Rx for maternity support belt and Flexeril  PRN. Keep appts as scheduled in office, return to MAU as needed for emergencies.  Assessment: 1. Round ligament pain   2. Supervision of high risk pregnancy, antepartum   3. History of preterm delivery, currently pregnant   4. Musculoskeletal pain     Plan: Discharge home Labor precautions and fetal kick counts Follow-up [place] for [facility] Follow up.   Specialty: Obstetrics and Gynecology Why:  As scheduled, return to MAU as needed for emergencies. Contact information: [address], [place] [phone] [place] [address] [phone]         Allergies as of 09/11/2019   No Known Allergies     Medication List    TAKE these medications   acetaminophen 500 MG tablet Commonly known as: TYLENOL Take 1,000 mg by mouth every 6 (six) hours as needed for mild pain.   AMBULATORY NON FORMULARY MEDICATION 1 Device by Other route once a week. Medication Name: Blood Pressure Cuff Monitored regularly at home ICD 10 O09.90   [place] 1 Device by Does not apply route daily.   cyclobenzaprine 10 MG tablet Commonly known as: FLEXERIL Take 0.5-1 tablets (5-10 mg total) by mouth 3 (three) times daily as needed for muscle spasms.   Doxylamine-Pyridoxine ER 20-20 MG Tbcr Commonly known as: [place] Take 1 tablet by mouth 2 (two) times daily at 8 am and 10 pm.   ferrous sulfate 325 (65 FE) MG tablet Take 1 tablet (325 mg total) by mouth daily.   imiquimod 5 % cream Commonly known as: Aldara Apply topically 3 (three) times a week.   Prenatal Adult Gummy/DHA/FA 0.4-25 MG Chew Chew 1 tablet by mouth daily.       Fatima Blank Certified Nurse-Midwife 09/11/2019 9:41 PM

## 2019-09-11 NOTE — MAU Note (Signed)
For 72hrs having pain L thigh in groin area and radiates to back. Pain is intermittent. Pain is worse with movement. Has tried Tylenol but unsure if helped because she fell asleep after taking it. Denies bleeding or LOF

## 2019-09-15 DIAGNOSIS — O339 Maternal care for disproportion, unspecified: Secondary | ICD-10-CM | POA: Diagnosis not present

## 2019-09-17 ENCOUNTER — Encounter: Payer: Self-pay | Admitting: Obstetrics & Gynecology

## 2019-09-17 ENCOUNTER — Other Ambulatory Visit: Payer: Self-pay

## 2019-09-17 ENCOUNTER — Telehealth (INDEPENDENT_AMBULATORY_CARE_PROVIDER_SITE_OTHER): Payer: Medicaid Other | Admitting: Obstetrics & Gynecology

## 2019-09-17 VITALS — BP 120/71 | HR 97

## 2019-09-17 DIAGNOSIS — O0993 Supervision of high risk pregnancy, unspecified, third trimester: Secondary | ICD-10-CM

## 2019-09-17 DIAGNOSIS — O09899 Supervision of other high risk pregnancies, unspecified trimester: Secondary | ICD-10-CM

## 2019-09-17 DIAGNOSIS — O99013 Anemia complicating pregnancy, third trimester: Secondary | ICD-10-CM

## 2019-09-17 DIAGNOSIS — O99019 Anemia complicating pregnancy, unspecified trimester: Secondary | ICD-10-CM

## 2019-09-17 DIAGNOSIS — Z3A33 33 weeks gestation of pregnancy: Secondary | ICD-10-CM

## 2019-09-17 DIAGNOSIS — O0990 Supervision of high risk pregnancy, unspecified, unspecified trimester: Secondary | ICD-10-CM

## 2019-09-17 DIAGNOSIS — O09213 Supervision of pregnancy with history of pre-term labor, third trimester: Secondary | ICD-10-CM

## 2019-09-17 MED ORDER — INTEGRA F 125-1 MG PO CAPS: 1.0000 | ORAL_CAPSULE | Freq: Every day | ORAL | 3 refills | Status: DC

## 2019-09-17 NOTE — Progress Notes (Signed)
I connected with  [name] on 09/17/19 at  3:15 PM EDT by telephone and verified that I am speaking with the correct person using two identifiers.   I discussed the limitations, risks, security and privacy concerns of performing an evaluation and management service by telephone and the availability of in person appointments. I also discussed with the patient that there may be a patient responsible charge related to this service. The patient expressed understanding and agreed to proceed.  Annabell Howells, RN 09/17/2019  3:02 PM

## 2019-09-17 NOTE — Progress Notes (Signed)
   [facility] VIRTUAL VIDEO VISIT ENCOUNTER NOTE  Provider location: Center for [employer] at [employer]   I connected with [name] on 09/17/19 at  3:15 PM EDT by MyChart Video Encounter at home and verified that I am speaking with the correct person using two identifiers.   I discussed the limitations, risks, security and privacy concerns of performing an evaluation and management service virtually and the availability of in person appointments. I also discussed with the patient that there may be a patient responsible charge related to this service. The patient expressed understanding and agreed to proceed. Subjective:  [name] is a 29 y.o. [phone] at [redacted]w[redacted]d being seen today for ongoing prenatal care.  She is currently monitored for the following issues for this low-risk pregnancy and has Supervision of high risk pregnancy, antepartum; History of preterm delivery, currently pregnant; and Anemia in pregnancy on their problem list.  Patient reports was seen in the MAU over the weekend. Feels better now.    .  Contractions: Not present. Vag. Bleeding: None.  Movement: Present. Denies any leaking of fluid.   The following portions of the patient's history were reviewed and updated as appropriate: allergies, current medications, past family history, past medical history, past social history, past surgical history and problem list.   Objective:   Vitals:   09/17/19 1505  BP: 120/71  Pulse: 97    Fetal Status:     Movement: Present     General:  Alert, oriented and cooperative. Patient is in no acute distress.  Respiratory: Normal respiratory effort, no problems with respiration noted  Mental Status: Normal mood and affect. Normal behavior. Normal judgment and thought content.  Rest of physical exam deferred due to type of encounter  Imaging: No results found.  Assessment and Plan:  Pregnancy: [id number] at [redacted]w[redacted]d 1. Supervision of high risk  pregnancy, antepartum +FM GBS and cx next visit   2. History of preterm delivery, currently pregnant occ ctx. Not painful.     3. Antepartum anemia Not on Iron S/p iron infusions this pregnancy  Integra 1 po q day  Preterm labor symptoms and general obstetric precautions including but not limited to vaginal bleeding, contractions, leaking of fluid and fetal movement were reviewed in detail with the patient. I discussed the assessment and treatment plan with the patient. The patient was provided an opportunity to ask questions and all were answered. The patient agreed with the plan and demonstrated an understanding of the instructions. The patient was advised to call back or seek an in-person office evaluation/go to MAU at [facility] for any urgent or concerning symptoms. Please refer to After Visit Summary for other counseling recommendations.   I provided 12 minutes of face-to-face time during this encounter.  Return in about 3 weeks (around 10/08/2019).  No future appointments.  Lavonia Drafts, MD Center for [employer], [place]

## 2019-10-07 ENCOUNTER — Other Ambulatory Visit: Payer: Self-pay | Admitting: Obstetrics & Gynecology

## 2019-10-07 DIAGNOSIS — A630 Anogenital (venereal) warts: Secondary | ICD-10-CM

## 2019-10-07 MED ORDER — IMIQUIMOD 5 % EX CREA: TOPICAL_CREAM | CUTANEOUS | 2 refills | Status: DC

## 2019-10-09 ENCOUNTER — Encounter: Payer: Medicaid Other | Admitting: Obstetrics & Gynecology

## 2019-10-09 NOTE — Progress Notes (Deleted)
   Patient did not show up today for her scheduled appointment.   Seona Clemenson, MD, FACOG Obstetrician & Gynecologist, [facility], [facility]  

## 2019-10-19 ENCOUNTER — Other Ambulatory Visit (HOSPITAL_COMMUNITY)
Admission: RE | Admit: 2019-10-19 | Discharge: 2019-10-19 | Disposition: A | Discharge status: Home / Self Care | Payer: Medicaid Other | Source: Ambulatory Visit | Attending: Obstetrics & Gynecology | Admitting: Obstetrics & Gynecology

## 2019-10-19 ENCOUNTER — Ambulatory Visit (INDEPENDENT_AMBULATORY_CARE_PROVIDER_SITE_OTHER): Payer: Medicaid Other | Admitting: Obstetrics & Gynecology

## 2019-10-19 ENCOUNTER — Other Ambulatory Visit: Payer: Self-pay

## 2019-10-19 VITALS — BP 123/68 | HR 81 | Temp 98.5°F | Wt 180.0 lb

## 2019-10-19 DIAGNOSIS — O09893 Supervision of other high risk pregnancies, third trimester: Secondary | ICD-10-CM

## 2019-10-19 DIAGNOSIS — Z3A37 37 weeks gestation of pregnancy: Secondary | ICD-10-CM

## 2019-10-19 DIAGNOSIS — O0990 Supervision of high risk pregnancy, unspecified, unspecified trimester: Secondary | ICD-10-CM

## 2019-10-19 DIAGNOSIS — O99891 Other specified diseases and conditions complicating pregnancy: Secondary | ICD-10-CM

## 2019-10-19 DIAGNOSIS — O2243 Hemorrhoids in pregnancy, third trimester: Secondary | ICD-10-CM

## 2019-10-19 DIAGNOSIS — O09899 Supervision of other high risk pregnancies, unspecified trimester: Secondary | ICD-10-CM

## 2019-10-19 DIAGNOSIS — O0993 Supervision of high risk pregnancy, unspecified, third trimester: Secondary | ICD-10-CM

## 2019-10-19 DIAGNOSIS — M549 Dorsalgia, unspecified: Secondary | ICD-10-CM

## 2019-10-19 MED ORDER — HYDROCORTISONE ACETATE 25 MG RE SUPP: 25.0000 mg | Freq: Two times a day (BID) | RECTAL | 1 refills | Status: DC

## 2019-10-19 MED ORDER — TETANUS-DIPHTH-ACELL PERTUSSIS 5-2.5-18.5 LF-MCG/0.5 IM SUSP
0.5000 mL | Freq: Once | INTRAMUSCULAR | Status: AC
  Administered 2019-10-19: 0.5 mL via INTRAMUSCULAR

## 2019-10-19 MED ORDER — CYCLOBENZAPRINE HCL 10 MG PO TABS: 10.0000 mg | ORAL_TABLET | Freq: Three times a day (TID) | ORAL | 3 refills | Status: DC | PRN

## 2019-10-19 NOTE — Progress Notes (Signed)
   PRENATAL VISIT NOTE  Subjective:  [name] is a 29 y.o. [id number] at [redacted]w[redacted]d being seen today for ongoing prenatal care.  She is currently monitored for the following issues for this high-risk pregnancy and has Supervision of high risk pregnancy, antepartum; History of preterm delivery, currently pregnant; and Anemia in pregnancy on their problem list.  Patient reports back pain, wants Flexeril.  Also wants something for hemorrhoids.  Contractions: Not present. Vag. Bleeding: None.  Movement: Present. Denies leaking of fluid.   The following portions of the patient's history were reviewed and updated as appropriate: allergies, current medications, past family history, past medical history, past social history, past surgical history and problem list.   Objective:   Vitals:   10/19/19 1535  BP: 123/68  Pulse: 81  Temp: 98.5 F (36.9 C)  Weight: 180 lb (81.6 kg)    Fetal Status: Fetal Heart Rate (bpm): 158   Movement: Present  Presentation: Vertex  General:  Alert, oriented and cooperative. Patient is in no acute distress.  Skin: Skin is warm and dry. No rash noted.   Cardiovascular: Normal heart rate noted  Respiratory: Normal respiratory effort, no problems with respiration noted  Abdomen: Soft, gravid, appropriate for gestational age.  Pain/Pressure: Present     Pelvic: Cervical exam performed Dilation: 1.5 Effacement (%): 50 Station: Ballotable  Extremities: Normal range of motion.  Edema: None  Mental Status: Normal mood and affect. Normal behavior. Normal judgment and thought content.   Assessment and Plan:  Pregnancy: [name] at [redacted]w[redacted]d 1. Hemorrhoids during pregnancy in third trimester - hydrocortisone (ANUSOL-HC) 25 MG suppository; Place 1 suppository (25 mg total) rectally 2 (two) times daily.  Dispense: 12 suppository; Refill: 1  2. Back pain affecting pregnancy in third trimester Flexeril refilled - cyclobenzaprine (FLEXERIL) 10 MG tablet; Take 1 tablet (10 mg  total) by mouth 3 (three) times daily as needed for muscle spasms.  Dispense: 30 tablet; Refill: 3  3. History of preterm delivery, currently pregnant Term now, yay!  4. Supervision of high risk pregnancy, antepartum Pelvic cultures done today. - Strep Gp B NAA - GC/Chlamydia probe amp ([place])not at [facility] - Tdap (BOOSTRIX) injection 0.5 mL  Term labor symptoms and general obstetric precautions including but not limited to vaginal bleeding, contractions, leaking of fluid and fetal movement were reviewed in detail with the patient. Please refer to After Visit Summary for other counseling recommendations.   Return in about 1 week (around 10/26/2019) for Virtual OB Visit         2 weeks: OFFICE [facility] Visit.  No future appointments.  Verita Schneiders, MD

## 2019-10-19 NOTE — Patient Instructions (Signed)
Return to office for any scheduled appointments. Call the office or go to the MAU at [facility] if:  You begin to have strong, frequent contractions  Your water breaks.  Sometimes it is a big gush of fluid, sometimes it is just a trickle that keeps getting your panties wet or running down your legs  You have vaginal bleeding.  It is normal to have a small amount of spotting if your cervix was checked.   You do not feel your baby moving like normal.  If you do not, get something to eat and drink and lay down and focus on feeling your baby move.   If your baby is still not moving like normal, you should call the office or go to MAU.  Any other obstetric concerns.   

## 2019-10-21 LAB — STREP GP B NAA: Strep Gp B NAA: NEGATIVE

## 2019-10-22 LAB — GC/CHLAMYDIA PROBE AMP (~~LOC~~) NOT AT ARMC
Chlamydia: NEGATIVE
Comment: NEGATIVE
Comment: NORMAL
Neisseria Gonorrhea: NEGATIVE

## 2019-10-27 ENCOUNTER — Other Ambulatory Visit: Payer: Self-pay

## 2019-10-27 ENCOUNTER — Encounter: Payer: Medicaid Other | Admitting: Obstetrics & Gynecology

## 2019-10-27 NOTE — Progress Notes (Signed)
0902- First attempt to contact pt. LVM informing pt of attempt and informed her I would call back closer to her appointment time.   0920- Second attempt to contact pt for mychart visit. LVM informing pt of attempt and informed her that she would receive a call to reschedule missed appointment.

## 2019-10-29 ENCOUNTER — Other Ambulatory Visit: Payer: Self-pay

## 2019-10-29 ENCOUNTER — Inpatient Hospital Stay (HOSPITAL_COMMUNITY): Payer: Medicaid Other | Admitting: Anesthesiology

## 2019-10-29 ENCOUNTER — Inpatient Hospital Stay (HOSPITAL_COMMUNITY)
Admission: AD | Admit: 2019-10-29 | Discharge: 2019-10-30 | DRG: 806 | Disposition: A | Discharge status: Home / Self Care | Payer: Medicaid Other | Attending: Obstetrics & Gynecology | Admitting: Obstetrics & Gynecology

## 2019-10-29 ENCOUNTER — Encounter (HOSPITAL_COMMUNITY): Payer: Self-pay | Admitting: *Deleted

## 2019-10-29 DIAGNOSIS — Z3A39 39 weeks gestation of pregnancy: Secondary | ICD-10-CM

## 2019-10-29 DIAGNOSIS — O358XX0 Maternal care for other (suspected) fetal abnormality and damage, not applicable or unspecified: Principal | ICD-10-CM | POA: Diagnosis present

## 2019-10-29 DIAGNOSIS — Z20828 Contact with and (suspected) exposure to other viral communicable diseases: Secondary | ICD-10-CM | POA: Diagnosis present

## 2019-10-29 DIAGNOSIS — D649 Anemia, unspecified: Secondary | ICD-10-CM | POA: Diagnosis not present

## 2019-10-29 DIAGNOSIS — O0990 Supervision of high risk pregnancy, unspecified, unspecified trimester: Secondary | ICD-10-CM

## 2019-10-29 DIAGNOSIS — O9902 Anemia complicating childbirth: Secondary | ICD-10-CM | POA: Diagnosis not present

## 2019-10-29 DIAGNOSIS — O99019 Anemia complicating pregnancy, unspecified trimester: Secondary | ICD-10-CM | POA: Diagnosis present

## 2019-10-29 DIAGNOSIS — O26893 Other specified pregnancy related conditions, third trimester: Secondary | ICD-10-CM | POA: Diagnosis present

## 2019-10-29 DIAGNOSIS — O09899 Supervision of other high risk pregnancies, unspecified trimester: Secondary | ICD-10-CM

## 2019-10-29 DIAGNOSIS — O0993 Supervision of high risk pregnancy, unspecified, third trimester: Secondary | ICD-10-CM

## 2019-10-29 DIAGNOSIS — O2243 Hemorrhoids in pregnancy, third trimester: Secondary | ICD-10-CM | POA: Diagnosis present

## 2019-10-29 HISTORY — DX: Supervision of high risk pregnancy, unspecified, third trimester: O09.93

## 2019-10-29 HISTORY — DX: Hemorrhoids in pregnancy, unspecified trimester: O22.40

## 2019-10-29 LAB — CBC
HCT: 28.3 % — ABNORMAL LOW (ref 36.0–46.0)
Hemoglobin: 9.2 g/dL — ABNORMAL LOW (ref 12.0–15.0)
MCH: 28.5 pg (ref 26.0–34.0)
MCHC: 32.5 g/dL (ref 30.0–36.0)
MCV: 87.6 fL (ref 80.0–100.0)
Platelets: 181 10*3/uL (ref 150–400)
RBC: 3.23 MIL/uL — ABNORMAL LOW (ref 3.87–5.11)
RDW: 14.3 % (ref 11.5–15.5)
WBC: 7.3 10*3/uL (ref 4.0–10.5)
nRBC: 0 % (ref 0.0–0.2)

## 2019-10-29 LAB — TYPE AND SCREEN
ABO/RH(D): O POS
Antibody Screen: NEGATIVE

## 2019-10-29 LAB — ABO/RH: ABO/RH(D): O POS

## 2019-10-29 LAB — RPR: RPR Ser Ql: NONREACTIVE

## 2019-10-29 LAB — SARS CORONAVIRUS 2 BY RT PCR (HOSPITAL ORDER, PERFORMED IN ~~LOC~~ HOSPITAL LAB): SARS Coronavirus 2: NEGATIVE

## 2019-10-29 MED ORDER — ACETAMINOPHEN 325 MG PO TABS
650.0000 mg | ORAL_TABLET | ORAL | Status: DC | PRN
  Administered 2019-10-29 – 2019-10-30 (×2): 650 mg via ORAL
  Filled 2019-10-29 (×2): qty 2

## 2019-10-29 MED ORDER — DIPHENHYDRAMINE HCL 25 MG PO CAPS: 25.0000 mg | ORAL_CAPSULE | Freq: Four times a day (QID) | ORAL | Status: DC | PRN

## 2019-10-29 MED ORDER — SIMETHICONE 80 MG PO CHEW: 80.0000 mg | CHEWABLE_TABLET | ORAL | Status: DC | PRN

## 2019-10-29 MED ORDER — OXYCODONE-ACETAMINOPHEN 5-325 MG PO TABS: 1.0000 | ORAL_TABLET | ORAL | Status: DC | PRN

## 2019-10-29 MED ORDER — IBUPROFEN 600 MG PO TABS
600.0000 mg | ORAL_TABLET | Freq: Four times a day (QID) | ORAL | Status: DC
  Administered 2019-10-29 – 2019-10-30 (×4): 600 mg via ORAL
  Filled 2019-10-29 (×4): qty 1

## 2019-10-29 MED ORDER — FENTANYL CITRATE (PF) 100 MCG/2ML IJ SOLN
100.0000 ug | INTRAMUSCULAR | Status: DC | PRN
  Administered 2019-10-29 (×2): 100 ug via INTRAVENOUS
  Filled 2019-10-29 (×2): qty 2

## 2019-10-29 MED ORDER — SODIUM CHLORIDE (PF) 0.9 % IJ SOLN
INTRAMUSCULAR | Status: DC | PRN
  Administered 2019-10-29: 12 mL/h via EPIDURAL

## 2019-10-29 MED ORDER — SOD CITRATE-CITRIC ACID 500-334 MG/5ML PO SOLN: 30.0000 mL | ORAL | Status: DC | PRN

## 2019-10-29 MED ORDER — EPHEDRINE 5 MG/ML INJ: 10.0000 mg | INTRAVENOUS | Status: DC | PRN

## 2019-10-29 MED ORDER — LACTATED RINGERS IV SOLN: 500.0000 mL | INTRAVENOUS | Status: DC | PRN

## 2019-10-29 MED ORDER — COCONUT OIL OIL: 1.0000 "application " | TOPICAL_OIL | Status: DC | PRN

## 2019-10-29 MED ORDER — ONDANSETRON HCL 4 MG PO TABS: 4.0000 mg | ORAL_TABLET | ORAL | Status: DC | PRN

## 2019-10-29 MED ORDER — PHENYLEPHRINE 40 MCG/ML (10ML) SYRINGE FOR IV PUSH (FOR BLOOD PRESSURE SUPPORT): 80.0000 ug | PREFILLED_SYRINGE | INTRAVENOUS | Status: DC | PRN

## 2019-10-29 MED ORDER — PHENYLEPHRINE 40 MCG/ML (10ML) SYRINGE FOR IV PUSH (FOR BLOOD PRESSURE SUPPORT)
PREFILLED_SYRINGE | INTRAVENOUS | Status: AC
  Filled 2019-10-29: qty 10

## 2019-10-29 MED ORDER — DIPHENHYDRAMINE HCL 50 MG/ML IJ SOLN: 12.5000 mg | INTRAMUSCULAR | Status: DC | PRN

## 2019-10-29 MED ORDER — WITCH HAZEL-GLYCERIN EX PADS: 1.0000 "application " | MEDICATED_PAD | CUTANEOUS | Status: DC | PRN

## 2019-10-29 MED ORDER — OXYCODONE HCL 5 MG PO TABS
10.0000 mg | ORAL_TABLET | ORAL | Status: DC | PRN
  Administered 2019-10-30: 10 mg via ORAL
  Filled 2019-10-29: qty 2

## 2019-10-29 MED ORDER — LACTATED RINGERS IV SOLN: 500.0000 mL | Freq: Once | INTRAVENOUS | Status: DC

## 2019-10-29 MED ORDER — OXYCODONE HCL 5 MG PO TABS
5.0000 mg | ORAL_TABLET | ORAL | Status: DC | PRN
  Administered 2019-10-29 – 2019-10-30 (×2): 5 mg via ORAL
  Filled 2019-10-29 (×2): qty 1

## 2019-10-29 MED ORDER — FENTANYL-BUPIVACAINE-NACL 0.5-0.125-0.9 MG/250ML-% EP SOLN: 12.0000 mL/h | EPIDURAL | Status: DC | PRN

## 2019-10-29 MED ORDER — SENNOSIDES-DOCUSATE SODIUM 8.6-50 MG PO TABS
2.0000 | ORAL_TABLET | ORAL | Status: DC
  Administered 2019-10-29: 2 via ORAL
  Filled 2019-10-29: qty 2

## 2019-10-29 MED ORDER — PRENATAL MULTIVITAMIN CH
1.0000 | ORAL_TABLET | Freq: Every day | ORAL | Status: DC
  Administered 2019-10-30: 1 via ORAL
  Filled 2019-10-29: qty 1

## 2019-10-29 MED ORDER — LACTATED RINGERS IV SOLN
INTRAVENOUS | Status: DC
  Administered 2019-10-29: 09:00:00 via INTRAVENOUS

## 2019-10-29 MED ORDER — ACETAMINOPHEN 325 MG PO TABS: 650.0000 mg | ORAL_TABLET | ORAL | Status: DC | PRN

## 2019-10-29 MED ORDER — OXYTOCIN BOLUS FROM INFUSION
500.0000 mL | Freq: Once | INTRAVENOUS | Status: AC
  Administered 2019-10-29: 13:00:00 500 mL via INTRAVENOUS

## 2019-10-29 MED ORDER — FENTANYL-BUPIVACAINE-NACL 0.5-0.125-0.9 MG/250ML-% EP SOLN
EPIDURAL | Status: AC
  Filled 2019-10-29: qty 250

## 2019-10-29 MED ORDER — OXYTOCIN 40 UNITS IN NORMAL SALINE INFUSION - SIMPLE MED
2.5000 [IU]/h | INTRAVENOUS | Status: DC
  Filled 2019-10-29: qty 1000

## 2019-10-29 MED ORDER — OXYCODONE-ACETAMINOPHEN 5-325 MG PO TABS: 2.0000 | ORAL_TABLET | ORAL | Status: DC | PRN

## 2019-10-29 MED ORDER — ZOLPIDEM TARTRATE 5 MG PO TABS: 5.0000 mg | ORAL_TABLET | Freq: Every evening | ORAL | Status: DC | PRN

## 2019-10-29 MED ORDER — BENZOCAINE-MENTHOL 20-0.5 % EX AERO
1.0000 "application " | INHALATION_SPRAY | CUTANEOUS | Status: DC | PRN
  Administered 2019-10-29: 1 via TOPICAL
  Filled 2019-10-29: qty 56

## 2019-10-29 MED ORDER — LIDOCAINE HCL (PF) 1 % IJ SOLN
30.0000 mL | INTRAMUSCULAR | Status: AC | PRN
  Administered 2019-10-29: 30 mL via SUBCUTANEOUS
  Filled 2019-10-29: qty 30

## 2019-10-29 MED ORDER — DIBUCAINE (PERIANAL) 1 % EX OINT: 1.0000 "application " | TOPICAL_OINTMENT | CUTANEOUS | Status: DC | PRN

## 2019-10-29 MED ORDER — TETANUS-DIPHTH-ACELL PERTUSSIS 5-2.5-18.5 LF-MCG/0.5 IM SUSP: 0.5000 mL | Freq: Once | INTRAMUSCULAR | Status: DC

## 2019-10-29 MED ORDER — ONDANSETRON HCL 4 MG/2ML IJ SOLN: 4.0000 mg | INTRAMUSCULAR | Status: DC | PRN

## 2019-10-29 MED ORDER — LIDOCAINE HCL (PF) 1 % IJ SOLN
INTRAMUSCULAR | Status: DC | PRN
  Administered 2019-10-29 (×2): 5 mL via EPIDURAL

## 2019-10-29 NOTE — Anesthesia Procedure Notes (Signed)
Epidural Patient location during procedure: OB  Staffing Anesthesiologist: Sorin Frimpong, MD Performed: anesthesiologist   Preanesthetic Checklist Completed: patient identified, site marked, surgical consent, pre-op evaluation, timeout performed, IV checked, risks and benefits discussed and monitors and equipment checked  Epidural Patient position: sitting Prep: DuraPrep Patient monitoring: heart rate, continuous pulse ox and blood pressure Approach: right paramedian Location: L3-L4 Injection technique: LOR saline  Needle:  Needle type: Tuohy  Needle gauge: 17 G Needle length: 9 cm and 9 Needle insertion depth: 6 cm Catheter type: closed end flexible Catheter size: 20 Guage Catheter at skin depth: 10 cm Test dose: negative  Assessment Events: blood not aspirated, injection not painful, no injection resistance, negative IV test and no paresthesia  Additional Notes Patient identified. Risks/Benefits/Options discussed with patient including but not limited to bleeding, infection, nerve damage, paralysis, failed block, incomplete pain control, headache, blood pressure changes, nausea, vomiting, reactions to medication both or allergic, itching and postpartum back pain. Confirmed with bedside nurse the patient's most recent platelet count. Confirmed with patient that they are not currently taking any anticoagulation, have any bleeding history or any family history of bleeding disorders. Patient expressed understanding and wished to proceed. All questions were answered. Sterile technique was used throughout the entire procedure. Please see nursing notes for vital signs. Test dose was given through epidural needle and negative prior to continuing to dose epidural or start infusion. Warning signs of high block given to the patient including shortness of breath, tingling/numbness in hands, complete motor block, or any concerning symptoms with instructions to call for help. Patient was given  instructions on fall risk and not to get out of bed. All questions and concerns addressed with instructions to call with any issues.     

## 2019-10-29 NOTE — Anesthesia Preprocedure Evaluation (Signed)

## 2019-10-29 NOTE — Lactation Note (Signed)
This note was copied from a baby's chart. Lactation Consultation Note  Patient Name: [name] Today's Date: 10/29/2019 Reason for consult: Initial assessment;Difficult latch;Term P4, 8 hour female infant. Infant had 3 stools since birth. Mom receives [facility] and she doesn't have breast pump at home. LC gave mom harmony hand pump with 24 mm flange. Mom shown how to use hand pump  & how to disassemble, clean, & reassemble parts. When [facility] entered room dad was attempting to help mom latch infant to breast. LC notice infant was fully cloth and swaddle in blankets. Per mom, infant has been falling asleep and only breastfeeding for 2 to 3 minutes and latch has been painful. LC discussed undressing infant and breastfeeding infant STS , taking to infant, rubbing infant's neck to stay awake for a longer feeding mom agreeable to [facility] suggestions and undressed infant.  Per mom, she is having a lot of cramping and pain with infant on her stomach,  she was attempting to latch infant in cradle hold. LC discussed with mom to latch infant in football hold, mom agreeable to try it. Mom latched infant on right breast using the football hold with infant undressed,  LC noticed infant has tendency to slip down to tip of mom's nipple. LC reposition mom's hand and asked mom provide good hand position support of infant's head, neck and shoulder. After few attempts infant sustained latch with depth, swallows observed and infant breastfed for 23 minutes. Per mom, she only felt  only a tug with this feeding, it  was not painful like previously and she likes the football hold position. Mom taught back hand expression and infant was given 2 ml of EBM on spoon. LC discussed  with parents that after 24 hours of life infant may cluster feed.  Mom knows to breastfeed infant according hunger cues, 8 to 12 times within 24 hours and on demand. Mom knows to call Nurse or [place] if she has any questions,  concerns or need assistance with latching infant to breast. Parents will do as much STS as possible. Mom made aware of O/P services, breastfeeding support groups, community resources, and our phone # for post-discharge questions.    Maternal Data Formula Feeding for Exclusion: No Has patient been taught Hand Expression?: Yes(Mom taught back hand expression and infant was given 2 ml of colostrum by spoon.) Does the patient have breastfeeding experience prior to this delivery?: Yes  Feeding Feeding Type: Breast Fed  LATCH Score Latch: Grasps breast easily, tongue down, lips flanged, rhythmical sucking.  Audible Swallowing: Spontaneous and intermittent  Type of Nipple: Everted at rest and after stimulation  Comfort (Breast/Nipple): Soft / non-tender  Hold (Positioning): Assistance needed to correctly position infant at breast and maintain latch.  LATCH Score: 9  Interventions Interventions: Breast feeding basics reviewed;Breast compression;Adjust position;Assisted with latch;Skin to skin;Support pillows;Position options;Hand express;Expressed milk;Hand pump  Lactation Tools Discussed/Used WIC Program: Yes Pump Review: Setup, frequency, and cleaning;Milk Storage Initiated by:: Vicente Serene, IBCLC Date initiated:: 10/29/19   Consult Status Consult Status: Follow-up Date: 10/30/19 Follow-up type: In-patient    Vicente Serene 10/29/2019, 9:23 PM

## 2019-10-29 NOTE — Anesthesia Postprocedure Evaluation (Signed)
Anesthesia Post Note  Patient: [name]  Procedure(s) Performed: AN AD HOC LABOR EPIDURAL     Patient location during evaluation: Mother Baby Anesthesia Type: Epidural Level of consciousness: awake and alert Pain management: pain level controlled Vital Signs Assessment: post-procedure vital signs reviewed and stable Respiratory status: spontaneous breathing, nonlabored ventilation and respiratory function stable Cardiovascular status: stable Postop Assessment: no headache, no backache and epidural receding Anesthetic complications: no    Last Vitals:  Vitals:   10/29/19 1401 10/29/19 1440  BP: 118/70 110/74  Pulse: 72 77  Resp: 16 17  Temp:  36.9 C  SpO2:      Last Pain:  Vitals:   10/29/19 1516  TempSrc:   PainSc: 7    Pain Goal:                Epidural/Spinal Function Cutaneous sensation: Normal sensation (10/29/19 1440), Patient able to flex knees: Yes (10/29/19 1440), Patient able to lift hips off bed: Yes (10/29/19 1440), Back pain beyond tenderness at insertion site: No (10/29/19 1440), Progressively worsening motor and/or sensory loss: No (10/29/19 1440), Bowel and/or bladder incontinence post epidural: No (10/29/19 1440)  Rossi Burdo

## 2019-10-29 NOTE — MAU Note (Signed)
C/o ctx sine 4am. Denies any vag bleeding or leaking. Good fetal movement. Felt

## 2019-10-29 NOTE — H&P (Addendum)
LABOR AND DELIVERY ADMISSION HISTORY AND PHYSICAL NOTE  [name] is a 29 y.o. female [phone] with IUP at 24w0dby 1st TMUS. EDD 11/05/19 presenting for SOL. Pregnancy complicated by anemia and hx of PTL.   10/19 SVE was 1.5/50/ballotable. Endorses contractions since 0400. She reports positive fetal movement. She denies leakage of fluid or vaginal bleeding. She denies headache, vision changes, dyspnea, or lower extremity edema.   Prenatal History/Complications: Preterm  Delivery  Anemia  Hemorrhoids  Depression   Past Medical History: Past Medical History:  Diagnosis Date  . Anemia   . Depression    Was on Zoloft  . Hemorrhoids during pregnancy   . Preterm labor     Past Surgical History: Past Surgical History:  Procedure Laterality Date  . WISDOM TOOTH EXTRACTION  2011    Obstetrical History: OB History    Gravida  4   Para  3   Term  2   Preterm  1   AB  0   Living  3     SAB  0   TAB  0   Ectopic  0   Multiple  0   Live Births  3           Social History: Social History   Socioeconomic History  . Marital status: Single    Spouse name: Not on file  . Number of children: Not on file  . Years of education: Not on file  . Highest education level: Not on file  Occupational History  . Not on file  Social Needs  . Financial resource strain: Not on file  . Food insecurity    Worry: Never true    Inability: Never true  . Transportation needs    Medical: No    Non-medical: No  Tobacco Use  . Smoking status: Never Smoker  . Smokeless tobacco: Never Used  Substance and Sexual Activity  . Alcohol use: No    Comment: not since pregnant, but before that on weekends  . Drug use: No  . Sexual activity: Yes    Birth control/protection: None  Lifestyle  . Physical activity    Days per week: Not on file    Minutes per session: Not on file  . Stress: Not on file  Relationships  . Social [place] phone: Not on file    Gets  together: Not on file    Attends religious service: Not on file    Active member of club or organization: Not on file    Attends meetings of clubs or organizations: Not on file    Relationship status: Not on file  Other Topics Concern  . Not on file  Social History Narrative  . Not on file    Family History: Family History  Problem Relation Age of Onset  . Asthma Sister   . Asthma Brother     Allergies: No Known Allergies  Medications Prior to Admission  Medication Sig Dispense Refill Last Dose  . acetaminophen (TYLENOL) 500 MG tablet Take 1,000 mg by mouth every 6 (six) hours as needed for mild pain.     .Marland KitchenAMBULATORY NON FORMULARY MEDICATION 1 Device by Other route once a week. Medication Name: Blood Pressure Cuff Monitored regularly at home ICD 10 O09.90 1 kit 0   . cyclobenzaprine (FLEXERIL) 10 MG tablet Take 1 tablet (10 mg total) by mouth 3 (three) times daily as needed for muscle spasms. 30 tablet 3   .  Doxylamine-Pyridoxine ER (BONJESTA) 20-20 MG TBCR Take 1 tablet by mouth 2 (two) times daily at 8 am and 10 pm. (Patient not taking: Reported on 06/15/2019) 60 tablet 0   . Elastic Bandages & Supports (COMFORT FIT MATERNITY SUPP MED) MISC 1 Device by Does not apply route daily. 1 each 0   . Fe Fum-FePoly-FA-Vit C-Vit B3 (INTEGRA F) 125-1 MG CAPS Take 1 capsule by mouth daily. (Patient not taking: Reported on 10/19/2019) 30 capsule 3   . ferrous sulfate 325 (65 FE) MG EC tablet Take 325 mg by mouth daily.     . hydrocortisone (ANUSOL-HC) 25 MG suppository Place 1 suppository (25 mg total) rectally 2 (two) times daily. 12 suppository 1   . imiquimod (ALDARA) 5 % cream Apply topically 3 (three) times a week. 12 each 2   . Prenatal MV & Min w/FA-DHA (PRENATAL ADULT GUMMY/DHA/FA) 0.4-25 MG CHEW Chew 1 tablet by mouth daily. 30 tablet 6      Review of Systems   All systems reviewed and negative except as stated in HPI  Blood pressure 114/70, pulse 74, temperature (!) 97.5 F  (36.4 C), temperature source Oral, resp. rate 16, height '5\' 2"'$  (1.575 m), weight 81.8 kg, last menstrual period 02/23/2019, SpO2 99 %, unknown if currently breastfeeding. General appearance: alert Lungs: clear to auscultation bilaterally Heart: regular rate and rhythm Abdomen: soft, non-tender; bowel sounds normal Extremities: No calf swelling or tenderness Presentation: cephalic vertex Fetal monitoring: Baseline 155, moderate variability, + accelerations, - decelerations,  Uterine activity: q 3-4 min  Dilation: 5 Effacement (%): 70 Station: -2 Exam by:: K.Wilson,RN   Prenatal labs: ABO, Rh: --/--/O POS, O POS Performed at [facility], [place] [address] [place], [place] [address]  [phone]) Antibody: NEG (10/29 0932) Rubella: 1.85 (08/19 1000) RPR: Non Reactive (08/19 1000)  HBsAg: Negative (08/19 1000)  HIV: Non Reactive (08/19 1000)  GBS: --[place] Cloud (10/19 1558)  1 hr Glucola: WNL Genetic screening:  Normal  Anatomy US:  6/19- left choroid plexus cyst (CPC).  EFW 360 gm 54%  Prenatal Transfer Tool  Maternal Diabetes: No Genetic Screening: Normal Maternal Ultrasounds/Referrals: Other: Fetal Ultrasounds or other Referrals:  None Maternal Substance Abuse:  No Significant Maternal Medications:  None Significant Maternal Lab Results: Group B Strep negative  Results for orders placed or performed during the hospital encounter of 10/29/19 (from the past 24 hour(s))  SARS Coronavirus 2 by RT PCR (hospital order, performed in [facility] hospital lab) Nasopharyngeal Nasopharyngeal Swab   Collection Time: 10/29/19  8:12 AM   Specimen: Nasopharyngeal Swab  Result Value Ref Range   SARS Coronavirus 2 NEGATIVE NEGATIVE  CBC   Collection Time: 10/29/19  8:51 AM  Result Value Ref Range   WBC 7.3 4.0 - 10.5 K/uL   RBC 3.23 (L) 3.87 - 5.11 MIL/uL   Hemoglobin 9.2 (L) 12.0 - 15.0 g/dL   HCT 28.3 (L) 36.0 - 46.0 %   MCV 87.6 80.0 - 100.0 fL   MCH 28.5 26.0 - 34.0 pg    MCHC 32.5 30.0 - 36.0 g/dL   RDW 14.3 11.5 - 15.5 %   Platelets 181 150 - 400 K/uL   nRBC 0.0 0.0 - 0.2 %  Type and screen [place]   Collection Time: 10/29/19  8:51 AM  Result Value Ref Range   ABO/RH(D) O POS    Antibody Screen NEG    Sample Expiration      11/01/2019,2359 Performed at [facility]  [facility], [place] [address], [place], [place] [address]   ABO/Rh   Collection Time: 10/29/19  8:51 AM  Result Value Ref Range   ABO/RH(D)      O POS Performed at [facility] [address], [place], [place] [address]     Patient Active Problem List   Diagnosis Date Noted  . Supervision of high-risk pregnancy, third trimester 10/29/2019  . Anemia in pregnancy 08/21/2019  . Supervision of high risk pregnancy, antepartum 06/15/2019  . History of preterm delivery, currently pregnant 06/15/2019    Assessment:  Janet Dhondt is a 29 y.o. female [phone] with IUP at 107w0dby 1st TMUS. EDD 11/05/19 presenting for SOL. Pregnancy complicated by anemia, hx of PTL, depression, and hemorrhoids.   #Labor: Presents in latent labor. SVE @ [id number]/-2. Will continue expectant management and hold on augmentation at this time., #Pain: Fentanyl 100 mcg IV q1hr prn and epidural upon request   #FWB: Cat ; 6/19- left choroid plexus cyst (CPC).  EFW 360 gm 54% #ID: GBS negative  #MOF: Both #MOC: POP  #Normocytic anemia: Hgb was 9.2 today. Will need iron supplementation post partum.  #Depression: Denies depressive symptoms at this time. Was on Zoloft previously. Will f/u about restarting post partum   JMaxie Better PGY-1, MD Labor and Delivery Teaching service  10/29/2019, 10:40 AM  I confirm that I have verified the information documented in the resident's note and that I have also personally reperformed the history, physical exam and all medical decision making activities of this service and have verified that all service and findings are accurately documented in  this student's note.   EGavin Pound CNM 10/30/2019 8:36 AM

## 2019-10-30 DIAGNOSIS — Z3A39 39 weeks gestation of pregnancy: Secondary | ICD-10-CM

## 2019-10-30 LAB — CBC
HCT: 23.4 % — ABNORMAL LOW (ref 36.0–46.0)
Hemoglobin: 7.7 g/dL — ABNORMAL LOW (ref 12.0–15.0)
MCH: 28.7 pg (ref 26.0–34.0)
MCHC: 32.9 g/dL (ref 30.0–36.0)
MCV: 87.3 fL (ref 80.0–100.0)
Platelets: 148 10*3/uL — ABNORMAL LOW (ref 150–400)
RBC: 2.68 MIL/uL — ABNORMAL LOW (ref 3.87–5.11)
RDW: 14.1 % (ref 11.5–15.5)
WBC: 11.6 10*3/uL — ABNORMAL HIGH (ref 4.0–10.5)
nRBC: 0 % (ref 0.0–0.2)

## 2019-10-30 MED ORDER — BENZOCAINE-MENTHOL 20-0.5 % EX AERO: 1.0000 "application " | INHALATION_SPRAY | CUTANEOUS | 0 refills | Status: DC | PRN

## 2019-10-30 MED ORDER — WITCH HAZEL-GLYCERIN EX PADS: 1.0000 "application " | MEDICATED_PAD | CUTANEOUS | 12 refills | Status: DC | PRN

## 2019-10-30 MED ORDER — SENNOSIDES-DOCUSATE SODIUM 8.6-50 MG PO TABS: 2.0000 | ORAL_TABLET | Freq: Two times a day (BID) | ORAL | 0 refills | Status: DC | PRN

## 2019-10-30 MED ORDER — DIBUCAINE (PERIANAL) 1 % EX OINT: 1.0000 "application " | TOPICAL_OINTMENT | CUTANEOUS | 0 refills | Status: DC | PRN

## 2019-10-30 MED ORDER — NORETHINDRONE 0.35 MG PO TABS: 1.0000 | ORAL_TABLET | Freq: Every day | ORAL | 11 refills | Status: DC

## 2019-10-30 MED ORDER — FERROUS SULFATE 325 (65 FE) MG PO TABS
325.0000 mg | ORAL_TABLET | Freq: Every day | ORAL | Status: DC
  Administered 2019-10-30: 325 mg via ORAL
  Filled 2019-10-30: qty 1

## 2019-10-30 MED ORDER — IBUPROFEN 600 MG PO TABS: 600.0000 mg | ORAL_TABLET | Freq: Four times a day (QID) | ORAL | 0 refills | Status: DC | PRN

## 2019-10-30 MED ORDER — FERROUS SULFATE 325 (65 FE) MG PO TBEC: 325.0000 mg | DELAYED_RELEASE_TABLET | Freq: Every day | ORAL | 3 refills | Status: DC

## 2019-10-30 MED ORDER — ACETAMINOPHEN 325 MG PO TABS: 650.0000 mg | ORAL_TABLET | Freq: Four times a day (QID) | ORAL | 0 refills | Status: DC | PRN

## 2019-10-30 NOTE — Discharge Summary (Addendum)
Postpartum Discharge Summary     Patient Name: [name] DOB: March 31, 1990 MRN: [id number]  Date of admission: 10/29/2019 Delivering Provider: Ulla Gallo B   Date of discharge: 10/30/2019  Admitting diagnosis: 39wks ctx Intrauterine pregnancy: [redacted]w[redacted]d    Secondary diagnosis:  Active Problems:   Anemia in pregnancy   Supervision of high-risk pregnancy, third trimester   [redacted] weeks gestation of pregnancy  Additional problems: None     Discharge diagnosis: Term Pregnancy Delivered and Anemia                                                                                                Post partum procedures:None  Augmentation: AROM and Pitocin  Complications: None  Hospital course:  Onset of Labor With Vaginal Delivery     29y.o. yo [id number] 326w0das admitted in Latent Labor on 10/29/2019. Patient had an uncomplicated labor course as follows. Presented at 5 cm and was augmented with low dose pitocin and AROM with uncomplicated delivery. Membrane Rupture Time/Date: 11:11 AM ,10/29/2019   Intrapartum Procedures: Episiotomy: None [1]                                         Lacerations:  2nd degree [3]  Patient had a delivery of a Viable infant. 10/29/2019  Information for the patient's newborn:  Verdejo, Girl Br[place]0[[id number]]Delivery Method: Vaginal, Spontaneous(Filed from Delivery Summary)     Pateint had an uncomplicated postpartum course. Hgb from 9.2 to 7.7 post-partum. Patient asymptomatic and ferrous sulfate initiated. POP's prescribed on discharge along with iron. She is ambulating, tolerating a regular diet, passing flatus, and urinating well. Patient is discharged home in stable condition on 10/30/19.  Delivery time: 12:48 PM    Magnesium Sulfate received: No BMZ received: No Rhophylac:No MMR:No Transfusion:No  Physical exam  Vitals:   10/29/19 1545 10/29/19 1938 10/29/19 2345 10/30/19 0300  BP: 100/61 116/76 103/64 128/85  Pulse: 72 74    Resp:  '20 16 16 16  ' Temp: 97.6 F (36.4 C) 97.8 F (36.6 C) 97.8 F (36.6 C) 97.9 F (36.6 C)  TempSrc: Oral  Oral Oral  SpO2: 100%     Weight:      Height:       General: alert, cooperative and no distress Lochia: appropriate Uterine Fundus: firm DVT Evaluation: No evidence of DVT seen on physical exam. Labs: Lab Results  Component Value Date   WBC 11.6 (H) 10/30/2019   HGB 7.7 (L) 10/30/2019   HCT 23.4 (L) 10/30/2019   MCV 87.3 10/30/2019   PLT 148 (L) 10/30/2019   CMP Latest Ref Rng & Units 03/30/2019  Glucose 70 - 99 mg/dL 91  BUN 6 - 20 mg/dL 7  Creatinine 0.44 - 1.00 mg/dL 0.59  Sodium 135 - 145 mmol/L 133(L)  Potassium 3.5 - 5.1 mmol/L 3.5  Chloride 98 - 111 mmol/L 105  CO2 22 - 32 mmol/L 22  Calcium 8.9 - 10.3 mg/dL 8.5(L)  Total Protein  6.5 - 8.1 g/dL 6.3(L)  Total Bilirubin 0.3 - 1.2 mg/dL 0.6  Alkaline Phos 38 - 126 U/L 39  AST 15 - 41 U/L 19  ALT 0 - 44 U/L 18    Discharge instruction: per After Visit Summary and "Baby and Me Booklet".  After visit meds:  Allergies as of 10/30/2019   No Known Allergies     Medication List    STOP taking these medications   Doxylamine-Pyridoxine ER 20-20 MG Tbcr Commonly known as: Bonjesta   Integra F 125-1 MG Caps     TAKE these medications   acetaminophen 325 MG tablet Commonly known as: Tylenol Take 2 tablets (650 mg total) by mouth every 6 (six) hours as needed (for pain scale < 4). What changed:   medication strength  how much to take  reasons to take this   AMBULATORY NON FORMULARY MEDICATION 1 Device by Other route once a week. Medication Name: Blood Pressure Cuff Monitored regularly at home ICD 10 O09.90   benzocaine-Menthol 20-0.5 % Aero Commonly known as: DERMOPLAST Apply 1 application topically as needed for irritation (perineal discomfort).   Comfort Fit Maternity Supp Med Misc 1 Device by Does not apply route daily.   cyclobenzaprine 10 MG tablet Commonly known as: FLEXERIL Take 1  tablet (10 mg total) by mouth 3 (three) times daily as needed for muscle spasms.   dibucaine 1 % Oint Commonly known as: NUPERCAINAL Place 1 application rectally as needed for hemorrhoids.   ferrous sulfate 325 (65 FE) MG EC tablet Take 1 tablet (325 mg total) by mouth daily with breakfast. What changed: when to take this   hydrocortisone 25 MG suppository Commonly known as: ANUSOL-HC Place 1 suppository (25 mg total) rectally 2 (two) times daily.   ibuprofen 600 MG tablet Commonly known as: ADVIL Take 1 tablet (600 mg total) by mouth every 6 (six) hours as needed.   imiquimod 5 % cream Commonly known as: Aldara Apply topically 3 (three) times a week.   norethindrone 0.35 MG tablet Commonly known as: MICRONOR Take 1 tablet (0.35 mg total) by mouth daily.   Prenatal Adult Gummy/DHA/FA 0.4-25 MG Chew Chew 1 tablet by mouth daily.   senna-docusate 8.6-50 MG tablet Commonly known as: Senokot-S Take 2 tablets by mouth 2 (two) times daily as needed for mild constipation.   witch hazel-glycerin pad Commonly known as: TUCKS Apply 1 application topically as needed for hemorrhoids.       Diet: routine diet  Activity: Advance as tolerated. Pelvic rest for 6 weeks.   Outpatient follow up:4 weeks Follow up Appt: Future Appointments  Date Time Provider [place]  11/06/2019  9:55 AM Anyanwu, Sallyanne Havers, MD [facility]   Follow up Visit:   Please schedule this patient for Postpartum visit in: 4 weeks with the following provider: Any provider Low risk pregnancy complicated by: None Delivery mode:  SVD Anticipated Birth Control:  POPs PP Procedures needed: None  Schedule Integrated [place] visit: no      Newborn Data: Live born female  Birth Weight: 7 lb 10 oz (3459 g) APGAR: 71, 9  Newborn Delivery   Birth date/time: 10/29/2019 12:48:00 Delivery type: Vaginal, Spontaneous      Baby Feeding: Breast Disposition:home with mother   10/30/2019 Chauncey Mann, MD

## 2019-10-30 NOTE — Progress Notes (Signed)
MOB was referred for history of depression/anxiety.  * Referral screened out by Clinical Social Worker because none of the following criteria appear to apply:  ~ History of anxiety/depression during this pregnancy, or of post-partum depression following prior delivery. ~ Diagnosis of anxiety and/or depression within last 3 years. Per chart review, MOB's anxiety dates back to 2016. No concerns noted in [facility] records.  OR * MOB's symptoms currently being treated with medication and/or therapy.  Please contact the Clinical Social Worker if needs arise or by MOB request.   Michaelangelo Mittelman, LCSWA  [facility] [phone]  

## 2019-11-05 ENCOUNTER — Encounter: Payer: Medicaid Other | Admitting: Obstetrics & Gynecology

## 2019-11-06 ENCOUNTER — Encounter: Payer: Medicaid Other | Admitting: Obstetrics & Gynecology

## 2019-12-01 ENCOUNTER — Telehealth: Payer: Self-pay | Admitting: Advanced Practice Midwife

## 2019-12-01 ENCOUNTER — Telehealth: Payer: Medicaid Other | Admitting: Advanced Practice Midwife

## 2019-12-01 ENCOUNTER — Other Ambulatory Visit: Payer: Self-pay

## 2019-12-01 ENCOUNTER — Encounter: Payer: Self-pay | Admitting: Advanced Practice Midwife

## 2019-12-01 DIAGNOSIS — Z5329 Procedure and treatment not carried out because of patient's decision for other reasons: Secondary | ICD-10-CM

## 2019-12-01 DIAGNOSIS — Z91199 Patient's noncompliance with other medical treatment and regimen due to unspecified reason: Secondary | ICD-10-CM

## 2019-12-01 NOTE — Telephone Encounter (Signed)
Attempted to contact patient to get her rescheduled for her missed postpartum visit. No answer, number stated that the call could not be completed at this time. No show letter mailed.

## 2019-12-01 NOTE — Progress Notes (Signed)
@  954am stating call could not be completed as dialed please check the number and try again. @1032am   Message stating call could not be completed as dialed please check the number and try again.

## 2020-02-08 DIAGNOSIS — Z7251 High risk heterosexual behavior: Secondary | ICD-10-CM

## 2020-02-09 MED ORDER — LEVONORGESTREL 1.5 MG PO TABS: 1.5000 mg | ORAL_TABLET | Freq: Once | ORAL | 0 refills | Status: AC

## 2020-03-01 ENCOUNTER — Telehealth (INDEPENDENT_AMBULATORY_CARE_PROVIDER_SITE_OTHER): Payer: Medicaid Other

## 2020-03-01 DIAGNOSIS — Z3009 Encounter for other general counseling and advice on contraception: Secondary | ICD-10-CM | POA: Diagnosis not present

## 2020-03-01 NOTE — Progress Notes (Signed)
I connected with  [name] on 03/01/20 at  3:15 PM EST by telephone and verified that I am speaking with the correct person using two identifiers.   I discussed the limitations, risks, security and privacy concerns of performing an evaluation and management service by telephone and the availability of in person appointments. I also discussed with the patient that there may be a patient responsible charge related to this service. The patient expressed understanding and agreed to proceed.  Janene Madeira Leita Lindbloom, CMA 03/01/2020  2:56 PM   Thinking of something long term, didn't like Depo provera or pills because they were hard to keep up with.

## 2020-03-01 NOTE — Patient Instructions (Signed)
[email or web address]   Contraception Choices Contraception, also called birth control, refers to methods or devices that prevent pregnancy. Hormonal methods Contraceptive implant  A contraceptive implant is a thin, plastic tube that contains a hormone. It is inserted into the upper part of the arm. It can remain in place for up to 3 years. Progestin-only injections Progestin-only injections are injections of progestin, a synthetic form of the hormone progesterone. They are given every 3 months by a health care provider. Birth control pills  Birth control pills are pills that contain hormones that prevent pregnancy. They must be taken once a day, preferably at the same time each day. Birth control patch  The birth control patch contains hormones that prevent pregnancy. It is placed on the skin and must be changed once a week for three weeks and removed on the fourth week. A prescription is needed to use this method of contraception. Vaginal ring  A vaginal ring contains hormones that prevent pregnancy. It is placed in the vagina for three weeks and removed on the fourth week. After that, the process is repeated with a new ring. A prescription is needed to use this method of contraception. Emergency contraceptive Emergency contraceptives prevent pregnancy after unprotected sex. They come in pill form and can be taken up to 5 days after sex. They work best the sooner they are taken after having sex. Most emergency contraceptives are available without a prescription. This method should not be used as your only form of birth control. Barrier methods Female condom  A female condom is a thin sheath that is worn over the penis during sex. Condoms keep sperm from going inside a woman's body. They can be used with a spermicide to increase their effectiveness. They should be disposed after a single use. Female condom  A female condom is a soft, loose-fitting sheath that is put into the vagina before sex. The  condom keeps sperm from going inside a woman's body. They should be disposed after a single use. Diaphragm  A diaphragm is a soft, dome-shaped barrier. It is inserted into the vagina before sex, along with a spermicide. The diaphragm blocks sperm from entering the uterus, and the spermicide kills sperm. A diaphragm should be left in the vagina for 6-8 hours after sex and removed within 24 hours. A diaphragm is prescribed and fitted by a health care provider. A diaphragm should be replaced every 1-2 years, after giving birth, after gaining more than 15 lb (6.8 kg), and after pelvic surgery. Cervical cap  A cervical cap is a round, soft latex or plastic cup that fits over the cervix. It is inserted into the vagina before sex, along with spermicide. It blocks sperm from entering the uterus. The cap should be left in place for 6-8 hours after sex and removed within 48 hours. A cervical cap must be prescribed and fitted by a health care provider. It should be replaced every 2 years. Sponge  A sponge is a soft, circular piece of polyurethane foam with spermicide on it. The sponge helps block sperm from entering the uterus, and the spermicide kills sperm. To use it, you make it wet and then insert it into the vagina. It should be inserted before sex, left in for at least 6 hours after sex, and removed and thrown away within 30 hours. Spermicides Spermicides are chemicals that kill or block sperm from entering the cervix and uterus. They can come as a cream, jelly, suppository, foam, or tablet. A spermicide should be   inserted into the vagina with an applicator at least 10-15 minutes before sex to allow time for it to work. The process must be repeated every time you have sex. Spermicides do not require a prescription. Intrauterine contraception Intrauterine device (IUD) An IUD is a T-shaped device that is put in a woman's uterus. There are two types:  Hormone IUD.This type contains progestin, a synthetic  form of the hormone progesterone. This type can stay in place for 3-5 years.  Copper IUD.This type is wrapped in copper wire. It can stay in place for 10 years.  Permanent methods of contraception Female tubal ligation In this method, a woman's fallopian tubes are sealed, tied, or blocked during surgery to prevent eggs from traveling to the uterus. Hysteroscopic sterilization In this method, a small, flexible insert is placed into each fallopian tube. The inserts cause scar tissue to form in the fallopian tubes and block them, so sperm cannot reach an egg. The procedure takes about 3 months to be effective. Another form of birth control must be used during those 3 months. Female sterilization This is a procedure to tie off the tubes that carry sperm (vasectomy). After the procedure, the man can still ejaculate fluid (semen). Natural planning methods Natural family planning In this method, a couple does not have sex on days when the woman could become pregnant. Calendar method This means keeping track of the length of each menstrual cycle, identifying the days when pregnancy can happen, and not having sex on those days. Ovulation method In this method, a couple avoids sex during ovulation. Symptothermal method This method involves not having sex during ovulation. The woman typically checks for ovulation by watching changes in her temperature and in the consistency of cervical mucus. Post-ovulation method In this method, a couple waits to have sex until after ovulation. Summary  Contraception, also called birth control, means methods or devices that prevent pregnancy.  Hormonal methods of contraception include implants, injections, pills, patches, vaginal rings, and emergency contraceptives.  Barrier methods of contraception can include female condoms, female condoms, diaphragms, cervical caps, sponges, and spermicides.  There are two types of IUDs (intrauterine devices). An IUD can be put in  a woman's uterus to prevent pregnancy for 3-5 years.  Permanent sterilization can be done through a procedure for males, females, or both.  Natural family planning methods involve not having sex on days when the woman could become pregnant. This information is not intended to replace advice given to you by your health care provider. Make sure you discuss any questions you have with your health care provider. Document Revised: 12/19/2017 Document Reviewed: 01/19/2017 Elsevier Patient Education  2020 Elsevier Inc.  

## 2020-03-01 NOTE — Progress Notes (Signed)
    TELEHEALTH GYNECOLOGY VIRTUAL VIDEO VISIT ENCOUNTER NOTE  Provider location: [facility] [place]   I connected with [name] on 03/01/20 at  3:15 PM EST by MyChart Video Encounter at home and verified that I am speaking with the correct person using two identifiers.   I discussed the limitations, risks, security and privacy concerns of performing an evaluation and management service virtually and the availability of in person appointments. I also discussed with the patient that there may be a patient responsible charge related to this service. The patient expressed understanding and agreed to proceed.   History:  [name] is a 30 y.o. [phone] female being evaluated today for contraception. She reports she has tried depo and pills in the past without good results.  She states she has researched nexplanon and IUDs, but is unsure of what she wants.  She reports that she would like something long-term that she doesn't want to remember.      Past Medical History:  Diagnosis Date  . Anemia   . Depression    Was on Zoloft  . Hemorrhoids during pregnancy   . Preterm labor    Past Surgical History:  Procedure Laterality Date  . WISDOM TOOTH EXTRACTION  2011   The following portions of the patient's history were reviewed and updated as appropriate: allergies, current medications, past family history, past medical history, past social history, past surgical history and problem list.   Health Maintenance:  ASCUS pap and positive HRHPV on June 2020. Review of Systems:  Pertinent items noted in HPI and remainder of comprehensive ROS otherwise negative.  Physical Exam:   General:  Alert, oriented and cooperative. Patient appears to be in no acute distress.  Mental Status: Normal mood and affect. Normal behavior. Normal judgment and thought content.   Respiratory: Normal respiratory effort, no problems with respiration noted  Rest of physical exam deferred due  to type of encounter  Labs and Imaging No results found for this or any previous visit (from the past 336 hour(s)). No results found.     Assessment and Plan:     1. Encounter for counseling regarding contraception -Reviewed birth control methods by duration/frequency.   -Further discussed methods based on insertion routes and hormonal components. -Questions and concerns addressed. -Encouraged to research all methods on [email or web address].  Also given information in AVS regarding contraception choices. -Encouraged to contact office, via mychart, with any further questions or concerns. -Discussed abstinence or condom usage for up to 2 weeks prior to appt for insertion of selected device.        I discussed the assessment and treatment plan with the patient. The patient was provided an opportunity to ask questions and all were answered. The patient agreed with the plan and demonstrated an understanding of the instructions.   The patient was advised to call back or seek an in-person evaluation/go to the ED if the symptoms worsen or if the condition fails to improve as anticipated.  I provided 8 minutes of face-to-face time during this encounter.   Cherre Robins, CNM [facility], [facility]

## 2020-03-25 ENCOUNTER — Other Ambulatory Visit: Payer: Self-pay | Admitting: *Deleted

## 2020-03-25 DIAGNOSIS — Z7251 High risk heterosexual behavior: Secondary | ICD-10-CM

## 2020-03-25 MED ORDER — LEVONORGESTREL 1.5 MG PO TABS: 1.5000 mg | ORAL_TABLET | Freq: Once | ORAL | 0 refills | Status: AC

## 2020-03-25 NOTE — Progress Notes (Signed)
Pt sent MyChart message requesting Rx for Plan B due to cost will be less with Rx rather than OTC. She is currently out of town in [place]. Rx e-prescribed as requested, pt notified.

## 2020-05-23 MED ORDER — LEVONORGESTREL 1.5 MG PO TABS: 1.5000 mg | ORAL_TABLET | Freq: Once | ORAL | Status: DC

## 2020-05-23 MED ORDER — LEVONORGESTREL 1.5 MG PO TABS: 1.5000 mg | ORAL_TABLET | Freq: Once | ORAL | 0 refills | Status: AC

## 2020-05-25 ENCOUNTER — Encounter: Payer: Self-pay | Admitting: Lactation Services

## 2020-07-28 ENCOUNTER — Other Ambulatory Visit (HOSPITAL_COMMUNITY)
Admission: RE | Admit: 2020-07-28 | Discharge: 2020-07-28 | Disposition: A | Discharge status: Home / Self Care | Payer: Medicaid Other | Source: Ambulatory Visit | Attending: Family Medicine | Admitting: Family Medicine

## 2020-07-28 ENCOUNTER — Other Ambulatory Visit: Payer: Self-pay

## 2020-07-28 ENCOUNTER — Ambulatory Visit (INDEPENDENT_AMBULATORY_CARE_PROVIDER_SITE_OTHER): Payer: Medicaid Other | Admitting: Lactation Services

## 2020-07-28 VITALS — BP 130/75 | HR 76 | Ht 62.0 in | Wt 162.7 lb

## 2020-07-28 DIAGNOSIS — N898 Other specified noninflammatory disorders of vagina: Secondary | ICD-10-CM

## 2020-07-28 NOTE — Progress Notes (Signed)
Patient here with concerns with vaginal discharge. Vaginal discharge started 7/10 and with a green tint. She reports the discharge is off and on and last seen 2 days ago. She denies odor. Patient reports she is not sexually active. Self swab obtained.

## 2020-07-29 LAB — CERVICOVAGINAL ANCILLARY ONLY
Bacterial Vaginitis (gardnerella): POSITIVE — AB
Candida Glabrata: NEGATIVE
Candida Vaginitis: NEGATIVE
Chlamydia: NEGATIVE
Comment: NEGATIVE
Comment: NEGATIVE
Comment: NEGATIVE
Comment: NEGATIVE
Comment: NEGATIVE
Comment: NORMAL
Neisseria Gonorrhea: NEGATIVE
Trichomonas: NEGATIVE

## 2020-07-29 NOTE — Progress Notes (Signed)
Patient was assessed and managed by nursing staff during this encounter. I have reviewed the chart and agree with the documentation and plan. I have also made any necessary editorial changes.  Andree Golphin, MD 07/29/2020 4:17 PM   

## 2020-08-01 ENCOUNTER — Other Ambulatory Visit: Payer: Self-pay | Admitting: Obstetrics and Gynecology

## 2020-08-01 MED ORDER — METRONIDAZOLE 500 MG PO TABS: 500.0000 mg | ORAL_TABLET | Freq: Two times a day (BID) | ORAL | 0 refills | Status: AC

## 2021-10-11 DIAGNOSIS — N760 Acute vaginitis: Secondary | ICD-10-CM | POA: Diagnosis not present

## 2021-10-11 DIAGNOSIS — Z01419 Encounter for gynecological examination (general) (routine) without abnormal findings: Secondary | ICD-10-CM | POA: Diagnosis not present

## 2021-10-11 DIAGNOSIS — Z0389 Encounter for observation for other suspected diseases and conditions ruled out: Secondary | ICD-10-CM | POA: Diagnosis not present

## 2021-10-11 DIAGNOSIS — R102 Pelvic and perineal pain: Secondary | ICD-10-CM | POA: Diagnosis not present

## 2021-10-11 DIAGNOSIS — Z3009 Encounter for other general counseling and advice on contraception: Secondary | ICD-10-CM | POA: Diagnosis not present

## 2022-01-18 DIAGNOSIS — H5213 Myopia, bilateral: Secondary | ICD-10-CM | POA: Diagnosis not present

## 2022-02-14 DIAGNOSIS — H52223 Regular astigmatism, bilateral: Secondary | ICD-10-CM | POA: Diagnosis not present

## 2022-02-14 DIAGNOSIS — H5213 Myopia, bilateral: Secondary | ICD-10-CM | POA: Diagnosis not present

## 2022-07-13 ENCOUNTER — Ambulatory Visit: Payer: Medicaid Other

## 2022-08-23 ENCOUNTER — Encounter: Payer: Self-pay | Admitting: *Deleted

## 2022-08-23 ENCOUNTER — Ambulatory Visit (INDEPENDENT_AMBULATORY_CARE_PROVIDER_SITE_OTHER): Payer: Medicaid Other | Admitting: *Deleted

## 2022-08-23 DIAGNOSIS — Z3201 Encounter for pregnancy test, result positive: Secondary | ICD-10-CM | POA: Diagnosis not present

## 2022-08-23 DIAGNOSIS — Z3200 Encounter for pregnancy test, result unknown: Secondary | ICD-10-CM

## 2022-08-23 LAB — POCT PREGNANCY, URINE: Preg Test, Ur: POSITIVE — AB

## 2022-08-23 NOTE — Progress Notes (Addendum)
[place] dropped off a urine for a pregnancy test which was positive. I called her to inform her and reached her voicemail and heard a message " the person you are trying to reach has a voicemail that has not been set up ". I will send a mychart message to patient. Nancy Fetter Addendum: 2:39 patient called back . I informed her her pregnancy test was positive. She confirms LMP 04/20/22 which makes her [redacted]w[redacted]d with EDD 01/25/23. I advised her to start prenatal care right away with provider her choice. She is already taking PNV. I reviewed all meds with her. She would like to go to [facility] again and lives in [place]. She will call [facility] to see if they can see her, if issues, she will call us back. Nancy Fetter

## 2022-09-06 ENCOUNTER — Telehealth (INDEPENDENT_AMBULATORY_CARE_PROVIDER_SITE_OTHER): Payer: Medicaid Other | Admitting: *Deleted

## 2022-09-06 DIAGNOSIS — O0990 Supervision of high risk pregnancy, unspecified, unspecified trimester: Secondary | ICD-10-CM | POA: Insufficient documentation

## 2022-09-06 DIAGNOSIS — O09899 Supervision of other high risk pregnancies, unspecified trimester: Secondary | ICD-10-CM

## 2022-09-06 MED ORDER — GOJJI WEIGHT SCALE MISC: 1.0000 | 0 refills | Status: DC | PRN

## 2022-09-06 NOTE — Progress Notes (Signed)
New OB Intake  I connected with  [name] on 09/06/22 at  1:15 PM EDT by MyChart Video Visit and verified that I am speaking with the correct person using two identifiers. Nurse is located at [facility] and pt is located at home.  I discussed the limitations, risks, security and privacy concerns of performing an evaluation and management service by telephone and the availability of in person appointments. I also discussed with the patient that there may be a patient responsible charge related to this service. The patient expressed understanding and agreed to proceed.  I explained I am completing New OB Intake today. We discussed her EDD of 01/27/23 that is based on LMP of 04/20/22. Pt is G5/P3104. I reviewed her allergies, medications, Medical/Surgical/OB history, and appropriate screenings. I informed her of [facility] services. [facility] information placed in AVS. Based on history, this is a/an  pregnancy complicated by HX PTD x1  .   Patient Active Problem List   Diagnosis Date Noted   Supervision of high risk pregnancy, antepartum 09/06/2022   History of preterm delivery, currently pregnant 09/06/2022    Concerns addressed today  Delivery Plans Plans to deliver at [facility]. Patient given information for [facility] Healthy Baby website for more information about [facility]. Patient is interested in water birth. Offered upcoming OB visit with CNM to discuss further.  MyChart/Babyscripts MyChart access verified. I explained pt will have some visits in office and some virtually. Babyscripts instructions given and order placed.   Blood Pressure Cuff/Weight Scale Patient has cuff from last pregnancy, thinks it works fine- will bring to new ob visit.  Explained after first prenatal appt pt will check weekly and document in Babyscripts. Patient does not  have weight scale. Weight scale ordered for patient to pick up from [employer].   Anatomy US Explained first scheduled [place] will be  around 19 weeks. Anatomy US scheduled for 09/27/22 at 0830. Pt notified to arrive at 0815.  Labs Discussed Avelina Laine genetic screening with patient. Would like both Panorama and Horizon drawn at new OB visit. Routine prenatal labs needed.  Covid Vaccine Patient has not had the covid vaccine.   Is patient a CenteringPregnancy candidate?  Declined Declined due to Childcare   Is patient a Mom+Baby Combined Care candidate?  Not a candidate    Social Determinants of Health Food Insecurity: Patient denies food insecurity. WIC Referral: Patient is interested in referral to [facility].  Transportation: Patient denies transportation needs. Childcare: Discussed no children allowed at ultrasound appointments. Offered childcare services; patient declines childcare services at this time.  First visit review I reviewed new OB appt with pt. I explained she will have a provider visit that includes pregnancy exam/ bloodwork/ panorama/ urine culture/pap. Explained pt will be seen by Dr. Nobie Putnam at first visit; encounter routed to appropriate provider. Explained that patient will be seen by pregnancy navigator following visit with provider.   Nancy Fetter 09/06/2022  1:59 PM

## 2022-09-06 NOTE — Patient Instructions (Addendum)
At our [facility], we work as an integrated team, providing care to address both physical and emotional health. Your medical provider may refer you to see our Behavioral Health Clinician [facility]) on the same day you see your medical provider, as availability permits; often scheduled virtually at your convenience.  Our [facility] is available to all patients, visits generally last between 20-30 minutes, but can be longer or shorter, depending on patient need. The [facility] offers help with stress management, coping with symptoms of depression and anxiety, major life changes , sleep issues, changing risky behavior, grief and loss, life stress, working on personal life goals, and  behavioral health issues, as these all affect your overall health and wellness.  The [facility] is NOT available for the following: FMLA paperwork, court-ordered evaluations, specialty assessments (custody or disability), letters to employers, or obtaining certification for an emotional support animal. The [facility] does not provide long-term therapy. You have the right to refuse integrated behavioral health services, or to reschedule to see the [facility] at a later date.  Confidentiality exception: If it is suspected that a child or disabled adult is being abused or neglected, we are required by law to report that to either Child Protective Services or Adult Management consultant.  If you have a diagnosis of Bipolar affective disorder, Schizophrenia, or recurrent Major depressive disorder, we will recommend that you establish care with a psychiatrist, as these are lifelong, chronic conditions, and we want your overall emotional health and medications to be more closely monitored. If you anticipate needing extended maternity leave due to mental health issues postpartum, it it recommended you inform your medical provider, so we can put in a referral to a psychiatrist as soon as possible. The [facility] is unable to recommend an extended maternity leave for mental  health issues. Your medical provider or [facility] may refer you to a therapist for ongoing, traditional therapy, or to a psychiatrist, for medication management, if it would benefit your overall health. Depending on your insurance, you may have a copay or be charged a deductible, depending on your insurance, to see the [facility]. If you are uninsured, it is recommended that you apply for financial assistance. (Forms may be requested at the front desk for in-person visits, via MyChart, or request a form during a virtual visit).  If you see the [facility] more than 6 times, you will have to complete a comprehensive clinical assessment interview with the [facility] to resume integrated services.  For virtual visits with the [facility], you must be physically in the state of [place] at the time of the visit. For example, if you live in [place], you will have to do an in-person visit with the [facility], and your out-of-state insurance may not cover behavioral health services in [place]. If you are going out of the state or country for any reason, the [facility] may see you virtually when you return to [place], but not while you are physically outside of [place].    Here is a link to the Pregnancy Navigators . Please Fill out prior to your New OB appointment.   English Link: [email or web address]  Spanish Link: [email or web address]   [email or web address] is a website with information to help you prepare for Labor and delivery, patient information, visitor information and more.    Considering Waterbirth? Guide for patients at [facility] for [facility]) Why consider waterbirth? Gentle birth for babies  Less pain medicine used in labor  May allow for passive descent/less pushing  May  reduce perineal tears  More mobility and instinctive maternal position changes  Increased maternal relaxation   Is waterbirth safe? What are the risks of infection, drowning or  other complications? Infection:  Very low risk (3.7 % for tub vs 4.8% for bed)  7 in 8000 waterbirths with documented infection  Poorly cleaned equipment most common cause  Slightly lower group B strep transmission rate  Drowning  Maternal:  Very low risk  Related to seizures or fainting  Newborn:  Very low risk. No evidence of increased risk of respiratory problems in multiple large studies  Physiological protection from breathing under water  Avoid underwater birth if there are any fetal complications  Once baby's head is out of the water, keep it out.  Birth complication  Some reports of cord trauma, but risk decreased by bringing baby to surface gradually  No evidence of increased risk of shoulder dystocia. Mothers can usually change positions faster in water than in a bed, possibly aiding the maneuvers to free the shoulder.   There are 2 things you MUST do to have a waterbirth with [facility]: Attend a waterbirth class at [facility] at [facility]   3rd Wednesday of every month from 7-9 pm (virtual during COVID) [employer] at [email or web address] or [email or web address] or by calling [phone] Bring [place] the certificate from the class to your prenatal appointment or send via MyChart Meet with a midwife at 36 weeks* to see if you can still plan a waterbirth and to sign the consent.   *We also recommend that you schedule as many of your prenatal visits with a midwife as possible.    Helpful information: You may want to bring a bathing suit top to the hospital to wear during labor but this is optional.  All other supplies are provided by the hospital. Please arrive at the hospital with signs of active labor, and do not wait at home until late in labor. It takes 45 min- 1 hour for fetal monitoring, and check in to your room to take place, plus transport and filling of the waterbirth tub.    Things that would prevent you from having a  waterbirth: Premature, <37wks  Previous cesarean birth  Presence of thick meconium-stained fluid  Multiple gestation (Twins, triplets, etc.)  Uncontrolled diabetes or gestational diabetes requiring medication  Hypertension diagnosed in pregnancy or preexisting hypertension (gestational hypertension, preeclampsia, or chronic hypertension) Fetal growth restriction (your baby measures less than 10th percentile on ultrasound) Heavy vaginal bleeding  Non-reassuring fetal heart rate  Active infection (MRSA, etc.). Group B Strep is NOT a contraindication for waterbirth.  If your labor has to be induced and induction method requires continuous monitoring of the baby's heart rate  Other risks/issues identified by your obstetrical provider   Please remember that birth is unpredictable. Under certain unforeseeable circumstances your provider may advise against giving birth in the tub. These decisions will be made on a case-by-case basis and with the safety of you and your baby as our highest priority.    Updated 04/04/22

## 2022-09-11 DIAGNOSIS — O0990 Supervision of high risk pregnancy, unspecified, unspecified trimester: Secondary | ICD-10-CM | POA: Diagnosis not present

## 2022-09-17 ENCOUNTER — Telehealth: Payer: Self-pay | Admitting: Family Medicine

## 2022-09-17 NOTE — Telephone Encounter (Signed)
Called patient to reschedule new ob appointment due to provider not in office, there was no answer to the phone call and the option to leave a voicemail was unavailable. A letter was mailed to the patient.

## 2022-09-17 NOTE — Telephone Encounter (Signed)
Patient called in stating sh needed to reschedule her new ob appointment,t here was nothing available nay sooner than her appointment that was already scheduled with the [facility] office so patient will be receiving care at that location now.

## 2022-09-19 ENCOUNTER — Encounter: Payer: Medicaid Other | Admitting: Family Medicine

## 2022-09-20 ENCOUNTER — Encounter: Payer: Medicaid Other | Admitting: Family Medicine

## 2022-09-27 ENCOUNTER — Ambulatory Visit: Payer: Medicaid Other

## 2022-10-04 ENCOUNTER — Encounter: Payer: Self-pay | Admitting: General Practice

## 2022-10-04 ENCOUNTER — Ambulatory Visit (INDEPENDENT_AMBULATORY_CARE_PROVIDER_SITE_OTHER): Payer: Medicaid Other | Admitting: Family Medicine

## 2022-10-04 ENCOUNTER — Encounter: Payer: Self-pay | Admitting: Family Medicine

## 2022-10-04 ENCOUNTER — Other Ambulatory Visit (HOSPITAL_COMMUNITY)
Admission: RE | Admit: 2022-10-04 | Discharge: 2022-10-04 | Disposition: A | Discharge status: Home / Self Care | Payer: Medicaid Other | Source: Ambulatory Visit | Attending: Family Medicine | Admitting: Family Medicine

## 2022-10-04 VITALS — BP 108/66 | HR 69 | Wt 156.0 lb

## 2022-10-04 DIAGNOSIS — Z3482 Encounter for supervision of other normal pregnancy, second trimester: Secondary | ICD-10-CM | POA: Diagnosis not present

## 2022-10-04 DIAGNOSIS — O0990 Supervision of high risk pregnancy, unspecified, unspecified trimester: Secondary | ICD-10-CM | POA: Insufficient documentation

## 2022-10-04 DIAGNOSIS — O09899 Supervision of other high risk pregnancies, unspecified trimester: Secondary | ICD-10-CM

## 2022-10-04 DIAGNOSIS — O0992 Supervision of high risk pregnancy, unspecified, second trimester: Secondary | ICD-10-CM | POA: Diagnosis not present

## 2022-10-04 DIAGNOSIS — Z3A23 23 weeks gestation of pregnancy: Secondary | ICD-10-CM

## 2022-10-04 DIAGNOSIS — O0932 Supervision of pregnancy with insufficient antenatal care, second trimester: Secondary | ICD-10-CM

## 2022-10-04 DIAGNOSIS — O09892 Supervision of other high risk pregnancies, second trimester: Secondary | ICD-10-CM | POA: Diagnosis not present

## 2022-10-04 DIAGNOSIS — O0931 Supervision of pregnancy with insufficient antenatal care, first trimester: Secondary | ICD-10-CM

## 2022-10-04 NOTE — Patient Instructions (Signed)

## 2022-10-04 NOTE — Progress Notes (Signed)
Subjective:  [name] is a [id number] [redacted]w[redacted]d being seen today for her first obstetrical visit.  Her obstetrical history is significant for  1 prior preterm delivery at [redacted]w[redacted]d . Patient does intend to breast feed. Pregnancy history fully reviewed. Patient feels pregnancy going well at this time with fewer symptoms than her last.   Patient reports no complaints.  BP 108/66   Pulse 69   Wt 156 lb (70.8 kg)   LMP 04/22/2022   BMI 28.53 kg/m   HISTORY: OB History  Gravida Para Term Preterm AB Living  5 4 3 1  0 4  SAB IAB Ectopic Multiple Live Births  0 0 0 0 4    # Outcome Date GA Lbr Len/2nd Weight Sex Delivery Anes PTL Lv  5 Current           4 Term 10/29/19 [redacted]w[redacted]d 07:04 / 00:44 7 lb 10 oz (3.459 kg) F Vag-Spont EPI  LIV     Birth Comments: anemia  3 Term 09/06/17 [redacted]w[redacted]d 06:32 / 00:06 7 lb 1.2 oz (3.21 kg) F Vag-Spont EPI  LIV     Birth Comments: no complications, anemia  2 Term 09/11/15 [redacted]w[redacted]d 15:00 / 00:21 7 lb 1.2 oz (3.209 kg) F Vag-Spont EPI  LIV     Birth Comments: no complications  1 Preterm 05/19/13 [redacted]w[redacted]d / 02:19 5 lb 11 oz (2.58 kg) F Vag-Spont EPI  LIV    Past Medical History:  Diagnosis Date   Anemia    Anemia in pregnancy 08/21/2019   Depression    Was on Zoloft   Hemorrhoids during pregnancy    Preterm labor    Supervision of high-risk pregnancy, third trimester 10/29/2019    Past Surgical History:  Procedure Laterality Date   WISDOM TOOTH EXTRACTION  2011    Family History  Problem Relation Age of Onset   Asthma Sister    Asthma Brother      Exam  BP 108/66   Pulse 69   Wt 156 lb (70.8 kg)   LMP 04/22/2022   BMI 28.53 kg/m   Chaperone present during exam  CONSTITUTIONAL: Well-developed, well-nourished female in no acute distress.  HENT:  Normocephalic, atraumatic, External right and left ear normal. Oropharynx is clear and moist EYES: Conjunctivae and EOM are normal. Pupils are equal, round, and reactive to light. No scleral icterus.   CARDIOVASCULAR: Normal heart rate noted, regular rhythm RESPIRATORY: Clear to auscultation bilaterally. Effort and breath sounds normal, no problems with respiration noted. BREASTS: Symmetric in size. No masses, skin changes, nipple drainage, or lymphadenopathy. ABDOMEN: Soft, normal bowel sounds, no distention noted.  No tenderness, rebound or guarding.  PELVIC: Normal appearing external genitalia; normal appearing vaginal mucosa and cervix. No abnormal discharge noted. Normal uterine size, no other palpable masses, no uterine or adnexal tenderness. MUSCULOSKELETAL: Normal range of motion. No tenderness.  No cyanosis, clubbing, or edema.   SKIN: Skin is warm and dry. No rash noted. Not diaphoretic. No erythema. No pallor. NEUROLOGIC: Alert and oriented to person, place, and time. Normal reflexes, muscle tone coordination. No cranial nerve deficit noted. PSYCHIATRIC: Normal mood and affect. Normal behavior. Normal judgment and thought content.    Assessment:    Pregnancy: [id number] Patient Active Problem List   Diagnosis Date Noted   Supervision of high risk pregnancy, antepartum 09/06/2022   History of preterm delivery, currently pregnant 09/06/2022      Plan:   1. Supervision of high risk pregnancy, antepartum Doing well in second trimester  2. History of preterm delivery, currently pregnant Prior delivery at 36.6wk, will follow up   Initial labs obtained Continue prenatal vitamins Reviewed recommended weight gain based on pre-gravid BMI Encouraged well-balanced diet Genetic & carrier screening discussed: requests Panorama and Horizon  Ultrasound discussed; fetal survey: requested The nature of [facility] [employer] with multiple MDs and other Advanced Practice Providers was explained to patient; also emphasized that fellows, residents, and students are part of our team.   Problem list reviewed and updated.  No follow-ups on file.    Lorriane Shire, MD 10/04/22

## 2022-10-05 LAB — CBC/D/PLT+RPR+RH+ABO+RUBIGG...
Antibody Screen: NEGATIVE
Basophils Absolute: 0 10*3/uL (ref 0.0–0.2)
Basos: 0 %
EOS (ABSOLUTE): 0.1 10*3/uL (ref 0.0–0.4)
Eos: 1 %
HCV Ab: NONREACTIVE
HIV Screen 4th Generation wRfx: NONREACTIVE
Hematocrit: 32.6 % — ABNORMAL LOW (ref 34.0–46.6)
Hemoglobin: 10.5 g/dL — ABNORMAL LOW (ref 11.1–15.9)
Hepatitis B Surface Ag: NEGATIVE
Immature Grans (Abs): 0.1 10*3/uL (ref 0.0–0.1)
Immature Granulocytes: 1 %
Lymphocytes Absolute: 1.8 10*3/uL (ref 0.7–3.1)
Lymphs: 23 %
MCH: 27.6 pg (ref 26.6–33.0)
MCHC: 32.2 g/dL (ref 31.5–35.7)
MCV: 86 fL (ref 79–97)
Monocytes Absolute: 0.5 10*3/uL (ref 0.1–0.9)
Monocytes: 6 %
Neutrophils Absolute: 5.6 10*3/uL (ref 1.4–7.0)
Neutrophils: 69 %
Platelets: 204 10*3/uL (ref 150–450)
RBC: 3.81 x10E6/uL (ref 3.77–5.28)
RDW: 13 % (ref 11.7–15.4)
RPR Ser Ql: NONREACTIVE
Rh Factor: POSITIVE
Rubella Antibodies, IGG: 1.77 index (ref >0.99)
WBC: 8 10*3/uL (ref 3.4–10.8)

## 2022-10-05 LAB — HCV INTERPRETATION

## 2022-10-08 LAB — CYTOLOGY - PAP
Chlamydia: NEGATIVE
Comment: NEGATIVE
Comment: NEGATIVE
Comment: NORMAL
Diagnosis: NEGATIVE
High risk HPV: NEGATIVE
Neisseria Gonorrhea: NEGATIVE

## 2022-10-08 LAB — URINE CULTURE, OB REFLEX

## 2022-10-08 LAB — CULTURE, OB URINE

## 2022-10-08 MED ORDER — CEFADROXIL 500 MG PO CAPS: 500.0000 mg | ORAL_CAPSULE | Freq: Two times a day (BID) | ORAL | 0 refills | Status: DC

## 2022-10-08 NOTE — Progress Notes (Signed)
Note opened in error.

## 2022-10-08 NOTE — Progress Notes (Deleted)
error 

## 2022-10-08 NOTE — Procedures (Signed)
error 

## 2022-10-08 NOTE — Progress Notes (Deleted)
Error

## 2022-10-08 NOTE — Addendum Note (Signed)
Addended by: Truett Mainland on: 10/08/2022 05:19 PM   Modules accepted: Orders

## 2022-10-09 LAB — PANORAMA PRENATAL TEST FULL PANEL:PANORAMA TEST PLUS 5 ADDITIONAL MICRODELETIONS: FETAL FRACTION: 13

## 2022-10-13 LAB — HORIZON CUSTOM: REPORT SUMMARY: POSITIVE — AB

## 2022-10-17 ENCOUNTER — Encounter: Payer: Self-pay | Admitting: Family Medicine

## 2022-10-17 DIAGNOSIS — M549 Dorsalgia, unspecified: Secondary | ICD-10-CM

## 2022-10-19 ENCOUNTER — Other Ambulatory Visit: Payer: Self-pay

## 2022-10-19 ENCOUNTER — Emergency Department (HOSPITAL_BASED_OUTPATIENT_CLINIC_OR_DEPARTMENT_OTHER)
Admission: EM | Admit: 2022-10-19 | Discharge: 2022-10-19 | Discharge status: Against Medical Advice | Payer: Medicaid Other | Attending: Emergency Medicine | Admitting: Emergency Medicine

## 2022-10-19 ENCOUNTER — Encounter (HOSPITAL_BASED_OUTPATIENT_CLINIC_OR_DEPARTMENT_OTHER): Payer: Self-pay | Admitting: Emergency Medicine

## 2022-10-19 ENCOUNTER — Emergency Department (HOSPITAL_BASED_OUTPATIENT_CLINIC_OR_DEPARTMENT_OTHER): Payer: Medicaid Other

## 2022-10-19 DIAGNOSIS — M79605 Pain in left leg: Secondary | ICD-10-CM

## 2022-10-19 DIAGNOSIS — M79662 Pain in left lower leg: Secondary | ICD-10-CM | POA: Diagnosis not present

## 2022-10-19 DIAGNOSIS — O26892 Other specified pregnancy related conditions, second trimester: Secondary | ICD-10-CM | POA: Diagnosis not present

## 2022-10-19 DIAGNOSIS — O99891 Other specified diseases and conditions complicating pregnancy: Secondary | ICD-10-CM | POA: Diagnosis not present

## 2022-10-19 DIAGNOSIS — Z3A25 25 weeks gestation of pregnancy: Secondary | ICD-10-CM | POA: Insufficient documentation

## 2022-10-19 DIAGNOSIS — N949 Unspecified condition associated with female genital organs and menstrual cycle: Secondary | ICD-10-CM

## 2022-10-19 NOTE — Discharge Instructions (Signed)
Your exam today was overall consistent with a musculoskeletal origin of your pain however we discussed doing blood work, checking your urine, and obtaining fetal heart tones to ensure nothing concerning is going on.  You state you need to go and have palpitations at home and did not want this additional work-up.  We did discuss we could give you additional pain medication if you stay in the emergency room.  He did not want to stay and will return if you are having severe pain.  We discussed the most common cause of your pain could be round ligament pain.  For any concerning symptoms please return to the emergency room.  Also recommend follow-up with OB.  Take Tylenol for your pain.  You can also use heating pad.

## 2022-10-19 NOTE — ED Notes (Signed)
Written and verbal inst to pt  Verbalized an understanding  To home with family  

## 2022-10-19 NOTE — ED Provider Notes (Signed)
[facility] EMERGENCY DEPARTMENT Provider Note   CSN: [id number] Arrival date & time: 10/19/22  1604     History  Chief Complaint  Patient presents with   Leg Pain    [name] is a 32 y.o. female.  32 year old female presents today for evaluation of left inguinal pain that has been ongoing for about 1 week.  Made worse by certain range of motion, ambulating, bearing weight.  There is no swelling in the area.  No no tenderness to palpation.  States she was treated for UTI last week still on antibiotics.  Denies vaginal bleeding, vaginal discharge, abdominal pain, nausea, vomiting, back pain.  She states while she was waiting out in the waiting room she had fullness in her vagina however this has resolved since.  It did not happen prior to this episode and has not been since.  The history is provided by the patient. No language interpreter was used.  .  She has not taken anything other than Tylenol prior to arrival with minimal relief.     Home Medications Prior to Admission medications   Medication Sig Start Date End Date Taking? Authorizing Provider  acetaminophen (TYLENOL) 325 MG tablet Take 2 tablets (650 mg total) by mouth every 6 (six) hours as needed (for pain scale < 4). 10/30/19   Fair, Hoyle Sauer, MD  AMBULATORY NON FORMULARY MEDICATION 1 Device by Other route once a week. Medication Name: Blood Pressure Cuff Monitored regularly at home ICD 10 O09.90 Patient not taking: Reported on 10/04/2022 06/15/19   Hermina Staggers, MD  benzocaine-Menthol (DERMOPLAST) 20-0.5 % AERO Apply 1 application topically as needed for irritation (perineal discomfort). Patient not taking: Reported on 10/04/2022 10/30/19   Joselyn Arrow, MD  cefadroxil (DURICEF) 500 MG capsule Take 1 capsule (500 mg total) by mouth 2 (two) times daily. 10/08/22   Levie Heritage, DO  cyclobenzaprine (FLEXERIL) 10 MG tablet Take 1 tablet (10 mg total) by mouth 3 (three) times daily as needed for  muscle spasms. Patient not taking: Reported on 10/04/2022 10/19/19   Anyanwu, Jethro Bastos, MD  dibucaine (NUPERCAINAL) 1 % OINT Place 1 application rectally as needed for hemorrhoids. Patient not taking: Reported on 10/04/2022 10/30/19   Joselyn Arrow, MD  Elastic Bandages & Supports (COMFORT FIT MATERNITY SUPP MED) MISC 1 Device by Does not apply route daily. Patient not taking: Reported on 10/04/2022 09/11/19   Hurshel Party, CNM  ferrous sulfate 325 (65 FE) MG EC tablet Take 1 tablet (325 mg total) by mouth daily with breakfast. 10/30/19   Fair, Hoyle Sauer, MD  hydrocortisone (ANUSOL-HC) 25 MG suppository Place 1 suppository (25 mg total) rectally 2 (two) times daily. Patient not taking: Reported on 10/04/2022 10/19/19   Anyanwu, Jethro Bastos, MD  imiquimod (ALDARA) 5 % cream Apply topically 3 (three) times a week. Patient not taking: Reported on 10/04/2022 10/07/19   Willodean Rosenthal, MD  Misc. Devices (GOJJI WEIGHT SCALE) MISC 1 Device by Does not apply route as needed. ICD 10 Z34.90 Patient not taking: Reported on 10/04/2022 09/06/22   Reva Bores, MD  norethindrone (MICRONOR) 0.35 MG tablet Take 1 tablet (0.35 mg total) by mouth daily. Patient not taking: Reported on 10/04/2022 10/30/19   Joselyn Arrow, MD  Prenatal MV & Min w/FA-DHA (PRENATAL ADULT GUMMY/DHA/FA) 0.4-25 MG CHEW Chew 1 tablet by mouth daily. Patient not taking: Reported on 10/04/2022 03/30/19   Calvert Cantor, CNM  Prenatal Vit-Fe Fumarate-FA (PRENATAL VITAMIN PO)  Take 1 tablet by mouth daily.    [provider]  witch hazel-glycerin (TUCKS) pad Apply 1 application topically as needed for hemorrhoids. Patient not taking: Reported on 10/04/2022 10/30/19   Joselyn Arrow, MD      Allergies    Patient has no known allergies.    Review of Systems   Review of Systems  Cardiovascular:  Negative for leg swelling.  Genitourinary:  Negative for difficulty urinating, dysuria and flank pain.  Musculoskeletal:   Positive for gait problem (antalgic gait). Negative for back pain.  Neurological:  Negative for light-headedness.  All other systems reviewed and are negative.   Physical Exam Updated Vital Signs BP 111/67   Pulse 84   Temp 98.4 F (36.9 C) (Oral)   Resp 18   Ht 5\' 2"  (1.575 m)   Wt 71.2 kg   LMP 04/22/2022   SpO2 100%   BMI 28.72 kg/m  Physical Exam Vitals and nursing note reviewed.  Constitutional:      General: She is not in acute distress.    Appearance: Normal appearance. She is not ill-appearing.  HENT:     Head: Normocephalic and atraumatic.     Nose: Nose normal.  Eyes:     Conjunctiva/sclera: Conjunctivae normal.  Cardiovascular:     Rate and Rhythm: Normal rate and regular rhythm.     Pulses: Normal pulses.  Pulmonary:     Effort: Pulmonary effort is normal. No respiratory distress.  Abdominal:     Tenderness: There is no abdominal tenderness. There is no right CVA tenderness, left CVA tenderness or guarding.  Musculoskeletal:        General: No deformity.     Comments: Cervical, thoracic, lumbar spine without tenderness to palpation.  Without flank tenderness.  Straight leg raise test negative bilaterally.  2+ DP pulse present bilaterally.  Pain reproduced with knee flexion, and abduction.  Without tenderness to palpation.  No swelling noted.  Sensation intact and symmetrical.  Skin:    Findings: No rash.  Neurological:     Mental Status: She is alert.     ED Results / Procedures / Treatments   Labs (all labs ordered are listed, but only abnormal results are displayed) Labs Reviewed - No data to display  EKG None  Radiology 04/24/2022 Venous Img Lower Unilateral Left  Result Date: 10/19/2022 CLINICAL DATA:  Left leg pain EXAM: LEFT LOWER EXTREMITY VENOUS DOPPLER ULTRASOUND TECHNIQUE: Gray-scale sonography with compression, as well as color and duplex ultrasound, were performed to evaluate the deep venous system(s) from the level of the common femoral vein  through the popliteal and proximal calf veins. COMPARISON:  None Available. FINDINGS: VENOUS Normal compressibility of the common femoral, superficial femoral, and popliteal veins, as well as the visualized calf veins. Visualized portions of profunda femoral vein and great saphenous vein unremarkable. No filling defects to suggest DVT on grayscale or color Doppler imaging. Doppler waveforms show normal direction of venous flow, normal respiratory plasticity and response to augmentation. Limited views of the contralateral common femoral vein are unremarkable. OTHER None. Limitations: none IMPRESSION: Negative. Electronically Signed   By: 10/21/2022 M.D.   On: 10/19/2022 17:57    Procedures Procedures    Medications Ordered in ED Medications - No data to display  ED Course/ Medical Decision Making/ A&P                           Medical Decision Making  31 year old female presents today for evaluation of above-mentioned complaints.  Exam overall reassuring.  Given she is [redacted] weeks pregnant we did discuss additional evaluation with toco monitor, UA, CBC, BMP.  However patient refused this work-up.  Initially she declined CBC, BMP given that she recently had this done a few days ago when she was diagnosed with UTI.  And then patient states she had obligations at home and she could not stay for the remaining work-up.  Patient is uncomfortable on exam due to pain.  We discussed that she states we can provide her medications to keep her comfortable however she declined and states she will return if she has any worsening symptoms.  We discussed at least obtaining a toco monitor to ensure that fetus is doing okay however she again declined states she will return for any concerning symptoms.  Discussed leaving is against our recommendations.  She understands this and would still like to leave.  She is still currently on her antibiotics that she has and will finish.  She will call her OB and we discussed  symptomatic management for potential left round ligament pain.  Patient ultimately left AGAINST MEDICAL ADVICE.  Patient was discussed with my attending however prior to my attending evaluating the patient she had left.  Her significant other was also at bedside who also echoed that they need to leave and cannot stay for additional work-up.  Significant other predominantly expresses frustrations regarding the extensive wait time in the lobby.   Final Clinical Impression(s) / ED Diagnoses Final diagnoses:  Left leg pain  Round ligament pain    Rx / DC Orders ED Discharge Orders     None         Evlyn Courier, PA-C 10/19/22 Lona Kettle    Ezequiel Essex, MD 10/19/22 2302

## 2022-10-19 NOTE — ED Notes (Signed)
Pt denies pregnancy issues, reports LT proximal lower extremity pain, decreased ROM, pain with attempted ambulation or bearing wt.

## 2022-10-19 NOTE — ED Triage Notes (Signed)
Pt arrives pov, to triage in wheelchair, c/o LT proximal leg pain, decreased ROM x 1 week. Pt denies injury, endorses [redacted] weeks pregnant. Pt reports fetal movement. G5p4

## 2022-10-25 ENCOUNTER — Ambulatory Visit: Payer: Medicaid Other | Attending: Family Medicine

## 2022-10-25 ENCOUNTER — Ambulatory Visit: Payer: Medicaid Other | Admitting: *Deleted

## 2022-10-25 ENCOUNTER — Other Ambulatory Visit: Payer: Self-pay | Admitting: *Deleted

## 2022-10-25 VITALS — BP 105/59 | HR 82

## 2022-10-25 DIAGNOSIS — O0990 Supervision of high risk pregnancy, unspecified, unspecified trimester: Secondary | ICD-10-CM

## 2022-10-25 DIAGNOSIS — D563 Thalassemia minor: Secondary | ICD-10-CM

## 2022-10-25 DIAGNOSIS — O09899 Supervision of other high risk pregnancies, unspecified trimester: Secondary | ICD-10-CM | POA: Diagnosis not present

## 2022-10-25 DIAGNOSIS — Z3492 Encounter for supervision of normal pregnancy, unspecified, second trimester: Secondary | ICD-10-CM

## 2022-10-25 DIAGNOSIS — Z148 Genetic carrier of other disease: Secondary | ICD-10-CM

## 2022-10-25 DIAGNOSIS — O09892 Supervision of other high risk pregnancies, second trimester: Secondary | ICD-10-CM

## 2022-10-26 MED ORDER — CYCLOBENZAPRINE HCL 10 MG PO TABS: 5.0000 mg | ORAL_TABLET | Freq: Three times a day (TID) | ORAL | 3 refills | Status: DC | PRN

## 2022-11-08 ENCOUNTER — Encounter: Payer: Medicaid Other | Admitting: Family Medicine

## 2022-11-27 ENCOUNTER — Ambulatory Visit: Payer: Medicaid Other

## 2022-11-27 ENCOUNTER — Ambulatory Visit: Payer: Medicaid Other | Attending: Maternal & Fetal Medicine

## 2022-11-29 ENCOUNTER — Encounter: Payer: Medicaid Other | Admitting: Family Medicine

## 2022-12-04 ENCOUNTER — Encounter: Payer: Self-pay | Admitting: Advanced Practice Midwife

## 2022-12-04 ENCOUNTER — Ambulatory Visit (INDEPENDENT_AMBULATORY_CARE_PROVIDER_SITE_OTHER): Payer: Medicaid Other | Admitting: Advanced Practice Midwife

## 2022-12-04 ENCOUNTER — Encounter: Payer: Self-pay | Admitting: General Practice

## 2022-12-04 VITALS — BP 107/71 | HR 85 | Wt 166.0 lb

## 2022-12-04 DIAGNOSIS — O09899 Supervision of other high risk pregnancies, unspecified trimester: Secondary | ICD-10-CM | POA: Diagnosis not present

## 2022-12-04 DIAGNOSIS — O0990 Supervision of high risk pregnancy, unspecified, unspecified trimester: Secondary | ICD-10-CM

## 2022-12-04 DIAGNOSIS — Z3A28 28 weeks gestation of pregnancy: Secondary | ICD-10-CM

## 2022-12-04 DIAGNOSIS — O09893 Supervision of other high risk pregnancies, third trimester: Secondary | ICD-10-CM

## 2022-12-04 DIAGNOSIS — Z3A31 31 weeks gestation of pregnancy: Secondary | ICD-10-CM

## 2022-12-04 DIAGNOSIS — O0993 Supervision of high risk pregnancy, unspecified, third trimester: Secondary | ICD-10-CM

## 2022-12-04 DIAGNOSIS — Z1332 Encounter for screening for maternal depression: Secondary | ICD-10-CM

## 2022-12-04 NOTE — Progress Notes (Signed)
PRENATAL VISIT NOTE  Subjective:  [name] is a 32 y.o. [id number] at [redacted]w[redacted]d being seen today for ongoing prenatal care.  She is currently monitored for the following issues for this high-risk pregnancy and has Supervision of high risk pregnancy, antepartum and History of preterm delivery, currently pregnant on their problem list.  Patient reports no complaints.  Contractions: Not present. Vag. Bleeding: None.  Movement: Present. Denies leaking of fluid.   The following portions of the patient's history were reviewed and updated as appropriate: allergies, current medications, past family history, past medical history, past social history, past surgical history and problem list.   Objective:   Vitals:   12/04/22 0830  BP: 107/71  Pulse: 85  Weight: 166 lb (75.3 kg)    Fetal Status: Fetal Heart Rate (bpm): 155   Movement: Present     General:  Alert, oriented and cooperative. Patient is in no acute distress.  Skin: Skin is warm and dry. No rash noted.   Cardiovascular: Normal heart rate noted  Respiratory: Normal respiratory effort, no problems with respiration noted  Abdomen: Soft, gravid, appropriate for gestational age.  Pain/Pressure: Absent     Pelvic: Cervical exam deferred        Extremities: Normal range of motion.     Mental Status: Normal mood and affect. Normal behavior. Normal judgment and thought content.   Assessment and Plan:  Pregnancy: [id number] at [redacted]w[redacted]d 1. Supervision of high risk pregnancy, antepartum  - Glucose Tolerance, 2 Hours w/1 Hour - RPR - HIV antibody (with reflex) - CBC  2. History of preterm delivery, currently pregnant     Preterm labor symptoms reviewed - Glucose Tolerance, 2 Hours w/1 Hour - RPR - HIV antibody (with reflex) - CBC  3. [redacted] weeks gestation of pregnancy      Waterbirth discussed      Reviewed needs to take class and meet with me again close to term      Reviewed R/B and possible changes in plan based on conditions    There are 2 things you MUST do to have a waterbirth with [facility]: Attend a waterbirth class at [facility] at [place]   3rd Wednesday of every month from 7-9 pm (virtual during COVID) [employer] at [email or web address] or [email or web address] or by calling [phone] Bring [place] the certificate from the class to your prenatal appointment or send via MyChart Meet with a midwife at 36 weeks* to see if you can still plan a waterbirth and to sign the consent.   *We also recommend that you schedule as many of your prenatal visits with a midwife as possible.    Helpful information: You may want to bring a bathing suit top to the hospital to wear during labor but this is optional.  All other supplies are provided by the hospital. Please arrive at the hospital with signs of active labor, and do not wait at home until late in labor. It takes 45 min- 2 hours for COVID testing, fetal monitoring, and check in to your room to take place, plus transport and filling of the waterbirth tub.    Things that would prevent you from having a waterbirth: Unknown or Positive COVID-19 diagnosis upon admission to hospital* Premature, <37wks  Previous cesarean birth  Presence of thick meconium-stained fluid  Multiple gestation (Twins, triplets, etc.)  Uncontrolled diabetes or gestational diabetes requiring medication  Hypertension diagnosed in pregnancy or preexisting hypertension (gestational hypertension, preeclampsia, or chronic hypertension) Fetal growth restriction (  your baby measures less than 10th percentile on ultrasound) Heavy vaginal bleeding  Non-reassuring fetal heart rate  Active infection (MRSA, etc.). Group B Strep is NOT a contraindication for waterbirth.  If your labor has to be induced and induction method requires continuous monitoring of the baby's heart rate  Other risks/issues identified by your obstetrical provider   Please remember that birth is  unpredictable. Under certain unforeseeable circumstances your provider may advise against giving birth in the tub. These decisions will be made on a case-by-case basis and with the safety of you and your baby as our highest priority.  Preterm labor symptoms and general obstetric precautions including but not limited to vaginal bleeding, contractions, leaking of fluid and fetal movement were reviewed in detail with the patient. Please refer to After Visit Summary for other counseling recommendations.   Return in about 2 weeks (around 12/18/2022) for [employer].  Future Appointments  Date Time Provider [place]  12/18/2022  8:55 AM Seabron Spates, CNM [facility] None  01/08/2023 10:35 AM Seabron Spates, CNM [facility] None  01/15/2023  9:35 AM Seabron Spates, CNM [facility] None  01/22/2023  8:55 AM Seabron Spates, CNM [facility] None  01/29/2023  8:55 AM Seabron Spates, CNM [facility] None    Hansel Feinstein, CNM

## 2022-12-05 ENCOUNTER — Encounter: Payer: Self-pay | Admitting: Advanced Practice Midwife

## 2022-12-05 LAB — CBC
Hematocrit: 26.8 % — ABNORMAL LOW (ref 34.0–46.6)
Hemoglobin: 8.9 g/dL — ABNORMAL LOW (ref 11.1–15.9)
MCH: 27.6 pg (ref 26.6–33.0)
MCHC: 33.2 g/dL (ref 31.5–35.7)
MCV: 83 fL (ref 79–97)
Platelets: 179 10*3/uL (ref 150–450)
RBC: 3.23 x10E6/uL — ABNORMAL LOW (ref 3.77–5.28)
RDW: 12.4 % (ref 11.7–15.4)
WBC: 8.6 10*3/uL (ref 3.4–10.8)

## 2022-12-05 LAB — GLUCOSE TOLERANCE, 2 HOURS W/ 1HR
Glucose, 1 hour: 101 mg/dL (ref 70–179)
Glucose, 2 hour: 76 mg/dL (ref 70–152)
Glucose, Fasting: 76 mg/dL (ref 70–91)

## 2022-12-05 LAB — HIV ANTIBODY (ROUTINE TESTING W REFLEX): HIV Screen 4th Generation wRfx: NONREACTIVE

## 2022-12-05 LAB — RPR: RPR Ser Ql: NONREACTIVE

## 2022-12-06 ENCOUNTER — Other Ambulatory Visit: Payer: Self-pay | Admitting: Advanced Practice Midwife

## 2022-12-06 NOTE — Progress Notes (Signed)
Order placed for Venofer infusion

## 2022-12-11 ENCOUNTER — Encounter: Payer: Self-pay | Admitting: Advanced Practice Midwife

## 2022-12-14 ENCOUNTER — Encounter (HOSPITAL_COMMUNITY): Payer: Medicaid Other

## 2022-12-17 ENCOUNTER — Inpatient Hospital Stay (HOSPITAL_COMMUNITY): Admission: RE | Admit: 2022-12-17 | Payer: Medicaid Other | Source: Ambulatory Visit

## 2022-12-18 ENCOUNTER — Ambulatory Visit (INDEPENDENT_AMBULATORY_CARE_PROVIDER_SITE_OTHER): Payer: Medicaid Other | Admitting: Family Medicine

## 2022-12-18 VITALS — BP 121/56 | HR 91 | Wt 165.0 lb

## 2022-12-18 DIAGNOSIS — O99019 Anemia complicating pregnancy, unspecified trimester: Secondary | ICD-10-CM

## 2022-12-18 DIAGNOSIS — O9903 Anemia complicating the puerperium: Secondary | ICD-10-CM

## 2022-12-18 DIAGNOSIS — O0990 Supervision of high risk pregnancy, unspecified, unspecified trimester: Secondary | ICD-10-CM

## 2022-12-18 DIAGNOSIS — Z3A33 33 weeks gestation of pregnancy: Secondary | ICD-10-CM

## 2022-12-18 DIAGNOSIS — O09899 Supervision of other high risk pregnancies, unspecified trimester: Secondary | ICD-10-CM

## 2022-12-18 DIAGNOSIS — O09893 Supervision of other high risk pregnancies, third trimester: Secondary | ICD-10-CM

## 2022-12-18 NOTE — Progress Notes (Signed)
   PRENATAL VISIT NOTE  Subjective:  [name] is a 32 y.o. K4M0102 at [redacted]w[redacted]d being seen today for ongoing prenatal care.  She is currently monitored for the following issues for this low-risk pregnancy and has Anemia affecting pregnancy; Supervision of high risk pregnancy, antepartum; and History of preterm delivery, currently pregnant on their problem list.  Patient reports no complaints.  Contractions: Irritability. Vag. Bleeding: None.  Movement: Present. Denies leaking of fluid.   The following portions of the patient's history were reviewed and updated as appropriate: allergies, current medications, past family history, past medical history, past social history, past surgical history and problem list.   Objective:   Vitals:   12/18/22 0859  BP: (!) 121/56  Pulse: 91  Weight: 165 lb (74.8 kg)    Fetal Status: Fetal Heart Rate (bpm): 140 Fundal Height: 33 cm Movement: Present  Presentation: Vertex  General:  Alert, oriented and cooperative. Patient is in no acute distress.  Skin: Skin is warm and dry. No rash noted.   Cardiovascular: Normal heart rate noted  Respiratory: Normal respiratory effort, no problems with respiration noted  Abdomen: Soft, gravid, appropriate for gestational age.  Pain/Pressure: Present     Pelvic: Cervical exam deferred        Extremities: Normal range of motion.  Edema: None  Mental Status: Normal mood and affect. Normal behavior. Normal judgment and thought content.   Assessment and Plan:  Pregnancy: [id number] at [redacted]w[redacted]d 1. Supervision of high risk pregnancy, antepartum FHT and FH normal  2. History of preterm delivery, currently pregnant Braxton hicks contractions  3. Anemia affecting pregnancy, antepartum Venofer  Preterm labor symptoms and general obstetric precautions including but not limited to vaginal bleeding, contractions, leaking of fluid and fetal movement were reviewed in detail with the patient. Please refer to After Visit Summary  for other counseling recommendations.   No follow-ups on file.  Future Appointments  Date Time Provider Department Center  01/08/2023 10:35 AM Aviva Signs, CNM [facility] None  01/15/2023  9:35 AM Aviva Signs, CNM [facility] None  01/22/2023  8:55 AM Aviva Signs, CNM [facility] None  01/29/2023  8:55 AM Aviva Signs, CNM [facility] None    Levie Heritage, DO

## 2022-12-31 NOTE — L&D Delivery Note (Signed)
OB/GYN Faculty Practice Delivery Note  [name] is a 33 y.o. X5Q0086 s/p VD at [redacted]w[redacted]d. She was admitted for SOL/SROM @0030 .   ROM: 0h 39m with clear fluid GBS Status:  Negative/-- (01/09 1105) Maximum Maternal Temperature: 99  Labor Progress: Initial SVE: Complete & pushing.   Delivery Date/Time: 01/17/2023 @0042  Delivery: Called to room and patient was complete and pushing with [name] assisting delivery. Head delivered LOA. No nuchal cord present. Shoulder and body delivered in usual fashion. Infant with spontaneous cry, placed on mother's abdomen, dried and stimulated. I took over care at this time. Cord clamped x 2 after 1-minute delay, and cut by FOB. Cord blood drawn. Placenta delivered spontaneously with gentle cord traction. Fundus firm with massage and  IM Pitocin. Labia, perineum, vagina, and cervix inspected without lacerations and good hemostasis.  Baby Weight: pending  Placenta: 3 vessel, intact. Sent to L&D Complications: Precip del.  Lacerations: None EBL: 62 mL Analgesia: Maternally supported   Infant:  APGAR (1 MIN): 9   APGAR (5 MINS): 9    Janet Sinnett Autry-Lott, DO OB Fellow, [place] [facility] [employer] 01/17/2023, 1:30 AM

## 2023-01-08 ENCOUNTER — Ambulatory Visit (INDEPENDENT_AMBULATORY_CARE_PROVIDER_SITE_OTHER): Payer: Medicaid Other | Admitting: Advanced Practice Midwife

## 2023-01-08 ENCOUNTER — Other Ambulatory Visit (HOSPITAL_COMMUNITY)
Admission: RE | Admit: 2023-01-08 | Discharge: 2023-01-08 | Disposition: A | Discharge status: Home / Self Care | Payer: Medicaid Other | Source: Ambulatory Visit | Attending: Advanced Practice Midwife | Admitting: Advanced Practice Midwife

## 2023-01-08 VITALS — BP 130/75 | HR 88 | Wt 164.0 lb

## 2023-01-08 DIAGNOSIS — Z3A36 36 weeks gestation of pregnancy: Secondary | ICD-10-CM | POA: Diagnosis not present

## 2023-01-08 DIAGNOSIS — O09899 Supervision of other high risk pregnancies, unspecified trimester: Secondary | ICD-10-CM

## 2023-01-08 DIAGNOSIS — O0993 Supervision of high risk pregnancy, unspecified, third trimester: Secondary | ICD-10-CM

## 2023-01-08 DIAGNOSIS — O09893 Supervision of other high risk pregnancies, third trimester: Secondary | ICD-10-CM

## 2023-01-08 DIAGNOSIS — O0990 Supervision of high risk pregnancy, unspecified, unspecified trimester: Secondary | ICD-10-CM

## 2023-01-08 NOTE — Progress Notes (Signed)
   PRENATAL VISIT NOTE  Subjective:  [name] is a 33 y.o. G2X5284 at [redacted]w[redacted]d being seen today for ongoing prenatal care.  She is currently monitored for the following issues for this low-risk pregnancy and has Anemia affecting pregnancy; Supervision of high risk pregnancy, antepartum; and History of preterm delivery, currently pregnant on their problem list.  Patient reports occasional contractions.  Contractions: Irritability. Vag. Bleeding: None.  Movement: Present. Denies leaking of fluid.   The following portions of the patient's history were reviewed and updated as appropriate: allergies, current medications, past family history, past medical history, past social history, past surgical history and problem list.   Objective:   Vitals:   01/08/23 1038  BP: 130/75  Pulse: 88  Weight: 164 lb (74.4 kg)    Fetal Status: Fetal Heart Rate (bpm): 148 Fundal Height: 35 cm Movement: Present     General:  Alert, oriented and cooperative. Patient is in no acute distress.  Skin: Skin is warm and dry. No rash noted.   Cardiovascular: Normal heart rate noted  Respiratory: Normal respiratory effort, no problems with respiration noted  Abdomen: Soft, gravid, appropriate for gestational age.  Pain/Pressure: Present     Pelvic: Cervical exam performed in the presence of a chaperone      Cervix 1-2/50/-3/vertex.   Cultures obtained  Extremities: Normal range of motion.  Edema: None  Mental Status: Normal mood and affect. Normal behavior. Normal judgment and thought content.   Assessment and Plan:  Pregnancy: X3K4401 at [redacted]w[redacted]d 1. Supervision of high risk pregnancy, antepartum  Ready! - GC/Chlamydia probe amp ([place])not at [facility] - Culture, beta strep (group b only)  2. History of preterm delivery, currently pregnant     Near term now  3. [redacted] weeks gestation of pregnancy  - GC/Chlamydia probe amp ([place])not at [facility] - Culture, beta strep (group b only)  Preterm labor symptoms  and general obstetric precautions including but not limited to vaginal bleeding, contractions, leaking of fluid and fetal movement were reviewed in detail with the patient. Please refer to After Visit Summary for other counseling recommendations.   No follow-ups on file.  Future Appointments  Date Time Provider [place]  01/15/2023  9:35 AM Seabron Spates, CNM [facility] None  01/22/2023  8:55 AM Seabron Spates, CNM [facility] None  01/29/2023  8:55 AM Seabron Spates, CNM [facility] None    Hansel Feinstein, CNM

## 2023-01-09 LAB — GC/CHLAMYDIA PROBE AMP (~~LOC~~) NOT AT ARMC
Chlamydia: NEGATIVE
Comment: NEGATIVE
Comment: NORMAL
Neisseria Gonorrhea: NEGATIVE

## 2023-01-12 LAB — CULTURE, BETA STREP (GROUP B ONLY): Strep Gp B Culture: NEGATIVE

## 2023-01-15 ENCOUNTER — Ambulatory Visit (INDEPENDENT_AMBULATORY_CARE_PROVIDER_SITE_OTHER): Payer: Medicaid Other | Admitting: Advanced Practice Midwife

## 2023-01-15 ENCOUNTER — Encounter: Payer: Self-pay | Admitting: Advanced Practice Midwife

## 2023-01-15 VITALS — BP 116/68 | HR 113 | Wt 166.0 lb

## 2023-01-15 DIAGNOSIS — O0990 Supervision of high risk pregnancy, unspecified, unspecified trimester: Secondary | ICD-10-CM

## 2023-01-15 DIAGNOSIS — O0993 Supervision of high risk pregnancy, unspecified, third trimester: Secondary | ICD-10-CM

## 2023-01-15 DIAGNOSIS — Z3A37 37 weeks gestation of pregnancy: Secondary | ICD-10-CM

## 2023-01-15 NOTE — Progress Notes (Signed)
   PRENATAL VISIT NOTE  Subjective:  [name] is a 33 y.o. Q2V9563 at [redacted]w[redacted]d being seen today for ongoing prenatal care.  She is currently monitored for the following issues for this low-risk pregnancy and has Anemia affecting pregnancy; Supervision of high risk pregnancy, antepartum; and History of preterm delivery, currently pregnant on their problem list.  Patient reports no complaints.  Contractions: Irritability. Vag. Bleeding: None.  Movement: Present. Denies leaking of fluid.   The following portions of the patient's history were reviewed and updated as appropriate: allergies, current medications, past family history, past medical history, past social history, past surgical history and problem list.   Objective:   Vitals:   01/15/23 0944  BP: 116/68  Pulse: (!) 113  Weight: 166 lb (75.3 kg)    Fetal Status: Fetal Heart Rate (bpm): 160 Fundal Height: 36 cm Movement: Present     General:  Alert, oriented and cooperative. Patient is in no acute distress.  Skin: Skin is warm and dry. No rash noted.   Cardiovascular: Normal heart rate noted  Respiratory: Normal respiratory effort, no problems with respiration noted  Abdomen: Soft, gravid, appropriate for gestational age.  Pain/Pressure: Present     Pelvic: Cervical exam deferred        Extremities: Normal range of motion.  Edema: None  Mental Status: Normal mood and affect. Normal behavior. Normal judgment and thought content.   Assessment and Plan:  Pregnancy: [id number] at [redacted]w[redacted]d 1. [redacted] weeks gestation of pregnancy     GBS  Negative     Decided against waterbirth     Plan [facility] at [facility] or next visit (not done at new OB, last was ASCUS in 2020)  Term labor symptoms and general obstetric precautions including but not limited to vaginal bleeding, contractions, leaking of fluid and fetal movement were reviewed in detail with the patient. Please refer to After Visit Summary for other counseling recommendations.   Return in  about 1 week (around 01/22/2023) for [facility] [employer] [facility].  Future Appointments  Date Time Provider [place]  01/22/2023  8:55 AM Seabron Spates, CNM [facility] None  01/29/2023  8:55 AM Jimmye Norman Wilkie Aye, CNM [facility] None    [name], CNM

## 2023-01-16 ENCOUNTER — Encounter: Payer: Self-pay | Admitting: Advanced Practice Midwife

## 2023-01-17 ENCOUNTER — Encounter (HOSPITAL_COMMUNITY): Payer: Self-pay | Admitting: Obstetrics & Gynecology

## 2023-01-17 ENCOUNTER — Other Ambulatory Visit: Payer: Self-pay

## 2023-01-17 ENCOUNTER — Inpatient Hospital Stay (HOSPITAL_COMMUNITY)
Admission: AD | Admit: 2023-01-17 | Discharge: 2023-01-18 | DRG: 807 | Disposition: A | Discharge status: Home / Self Care | Payer: Medicaid Other | Attending: Obstetrics & Gynecology | Admitting: Obstetrics & Gynecology

## 2023-01-17 DIAGNOSIS — O9902 Anemia complicating childbirth: Secondary | ICD-10-CM | POA: Diagnosis not present

## 2023-01-17 DIAGNOSIS — Z148 Genetic carrier of other disease: Secondary | ICD-10-CM | POA: Diagnosis not present

## 2023-01-17 DIAGNOSIS — Z23 Encounter for immunization: Secondary | ICD-10-CM | POA: Diagnosis not present

## 2023-01-17 DIAGNOSIS — O4202 Full-term premature rupture of membranes, onset of labor within 24 hours of rupture: Secondary | ICD-10-CM | POA: Diagnosis not present

## 2023-01-17 DIAGNOSIS — Z3A37 37 weeks gestation of pregnancy: Secondary | ICD-10-CM | POA: Diagnosis not present

## 2023-01-17 DIAGNOSIS — O26893 Other specified pregnancy related conditions, third trimester: Secondary | ICD-10-CM | POA: Diagnosis not present

## 2023-01-17 LAB — CBC
HCT: 29.2 % — ABNORMAL LOW (ref 36.0–46.0)
Hemoglobin: 9.7 g/dL — ABNORMAL LOW (ref 12.0–15.0)
MCH: 27.6 pg (ref 26.0–34.0)
MCHC: 33.2 g/dL (ref 30.0–36.0)
MCV: 83 fL (ref 80.0–100.0)
Platelets: 216 10*3/uL (ref 150–400)
RBC: 3.52 MIL/uL — ABNORMAL LOW (ref 3.87–5.11)
RDW: 13.6 % (ref 11.5–15.5)
WBC: 11.1 10*3/uL — ABNORMAL HIGH (ref 4.0–10.5)
nRBC: 0 % (ref 0.0–0.2)

## 2023-01-17 LAB — POCT FERN TEST: POCT Fern Test: POSITIVE

## 2023-01-17 MED ORDER — SOD CITRATE-CITRIC ACID 500-334 MG/5ML PO SOLN: 30.0000 mL | ORAL | Status: DC | PRN

## 2023-01-17 MED ORDER — OXYCODONE-ACETAMINOPHEN 5-325 MG PO TABS: 2.0000 | ORAL_TABLET | ORAL | Status: DC | PRN

## 2023-01-17 MED ORDER — ONDANSETRON HCL 4 MG/2ML IJ SOLN: 4.0000 mg | INTRAMUSCULAR | Status: DC | PRN

## 2023-01-17 MED ORDER — OXYCODONE-ACETAMINOPHEN 5-325 MG PO TABS
1.0000 | ORAL_TABLET | ORAL | Status: DC | PRN
  Administered 2023-01-17: 1 via ORAL
  Filled 2023-01-17: qty 1

## 2023-01-17 MED ORDER — LACTATED RINGERS IV SOLN: 500.0000 mL | INTRAVENOUS | Status: DC | PRN

## 2023-01-17 MED ORDER — ZOLPIDEM TARTRATE 5 MG PO TABS: 5.0000 mg | ORAL_TABLET | Freq: Every evening | ORAL | Status: DC | PRN

## 2023-01-17 MED ORDER — LACTATED RINGERS IV SOLN: INTRAVENOUS | Status: DC

## 2023-01-17 MED ORDER — SENNOSIDES-DOCUSATE SODIUM 8.6-50 MG PO TABS
2.0000 | ORAL_TABLET | Freq: Every day | ORAL | Status: DC
  Administered 2023-01-18: 2 via ORAL
  Filled 2023-01-17: qty 2

## 2023-01-17 MED ORDER — ONDANSETRON HCL 4 MG/2ML IJ SOLN: 4.0000 mg | Freq: Four times a day (QID) | INTRAMUSCULAR | Status: DC | PRN

## 2023-01-17 MED ORDER — OXYTOCIN 10 UNIT/ML IJ SOLN
INTRAMUSCULAR | Status: AC
  Administered 2023-01-17: 10 [IU]
  Filled 2023-01-17: qty 1

## 2023-01-17 MED ORDER — ONDANSETRON HCL 4 MG PO TABS
4.0000 mg | ORAL_TABLET | ORAL | Status: DC | PRN
  Administered 2023-01-17: 4 mg via ORAL
  Filled 2023-01-17: qty 1

## 2023-01-17 MED ORDER — TETANUS-DIPHTH-ACELL PERTUSSIS 5-2.5-18.5 LF-MCG/0.5 IM SUSY
0.5000 mL | PREFILLED_SYRINGE | Freq: Once | INTRAMUSCULAR | Status: AC
  Administered 2023-01-18: 0.5 mL via INTRAMUSCULAR
  Filled 2023-01-17: qty 0.5

## 2023-01-17 MED ORDER — OXYTOCIN-SODIUM CHLORIDE 30-0.9 UT/500ML-% IV SOLN: 2.5000 [IU]/h | INTRAVENOUS | Status: DC

## 2023-01-17 MED ORDER — ACETAMINOPHEN 325 MG PO TABS
650.0000 mg | ORAL_TABLET | ORAL | Status: DC | PRN
  Administered 2023-01-17 (×3): 650 mg via ORAL
  Filled 2023-01-17 (×3): qty 2

## 2023-01-17 MED ORDER — BENZOCAINE-MENTHOL 20-0.5 % EX AERO
1.0000 | INHALATION_SPRAY | CUTANEOUS | Status: DC | PRN
  Administered 2023-01-17: 1 via TOPICAL
  Filled 2023-01-17: qty 56

## 2023-01-17 MED ORDER — KETOROLAC TROMETHAMINE 10 MG PO TABS
10.0000 mg | ORAL_TABLET | Freq: Four times a day (QID) | ORAL | Status: AC
  Administered 2023-01-17 – 2023-01-18 (×3): 10 mg via ORAL
  Filled 2023-01-17 (×3): qty 1

## 2023-01-17 MED ORDER — OXYCODONE HCL 5 MG PO TABS
5.0000 mg | ORAL_TABLET | Freq: Four times a day (QID) | ORAL | Status: DC | PRN
  Administered 2023-01-17 – 2023-01-18 (×2): 5 mg via ORAL
  Filled 2023-01-17 (×2): qty 1

## 2023-01-17 MED ORDER — DIPHENHYDRAMINE HCL 25 MG PO CAPS: 25.0000 mg | ORAL_CAPSULE | Freq: Four times a day (QID) | ORAL | Status: DC | PRN

## 2023-01-17 MED ORDER — PRENATAL MULTIVITAMIN CH
1.0000 | ORAL_TABLET | Freq: Every day | ORAL | Status: DC
  Administered 2023-01-17 – 2023-01-18 (×2): 1 via ORAL
  Filled 2023-01-17 (×2): qty 1

## 2023-01-17 MED ORDER — LIDOCAINE HCL (PF) 1 % IJ SOLN: 30.0000 mL | INTRAMUSCULAR | Status: DC | PRN

## 2023-01-17 MED ORDER — DIBUCAINE (PERIANAL) 1 % EX OINT: 1.0000 | TOPICAL_OINTMENT | CUTANEOUS | Status: DC | PRN

## 2023-01-17 MED ORDER — OXYCODONE HCL 5 MG PO TABS
5.0000 mg | ORAL_TABLET | Freq: Once | ORAL | Status: AC
  Administered 2023-01-17: 5 mg via ORAL
  Filled 2023-01-17: qty 1

## 2023-01-17 MED ORDER — SIMETHICONE 80 MG PO CHEW: 80.0000 mg | CHEWABLE_TABLET | ORAL | Status: DC | PRN

## 2023-01-17 MED ORDER — OXYTOCIN BOLUS FROM INFUSION: 333.0000 mL | Freq: Once | INTRAVENOUS | Status: DC

## 2023-01-17 MED ORDER — WITCH HAZEL-GLYCERIN EX PADS: 1.0000 | MEDICATED_PAD | CUTANEOUS | Status: DC | PRN

## 2023-01-17 MED ORDER — COCONUT OIL OIL: 1.0000 | TOPICAL_OIL | Status: DC | PRN

## 2023-01-17 MED ORDER — ACETAMINOPHEN 325 MG PO TABS: 650.0000 mg | ORAL_TABLET | ORAL | Status: DC | PRN

## 2023-01-17 MED ORDER — OXYTOCIN-SODIUM CHLORIDE 30-0.9 UT/500ML-% IV SOLN
INTRAVENOUS | Status: AC
  Filled 2023-01-17: qty 500

## 2023-01-17 MED ORDER — IBUPROFEN 600 MG PO TABS
600.0000 mg | ORAL_TABLET | Freq: Four times a day (QID) | ORAL | Status: DC
  Administered 2023-01-17 (×3): 600 mg via ORAL
  Filled 2023-01-17 (×2): qty 1

## 2023-01-17 NOTE — Plan of Care (Signed)

## 2023-01-17 NOTE — H&P (Signed)
OBSTETRIC ADMISSION HISTORY AND PHYSICAL  [name] is a 33 y.o. female [phone] with IUP at [redacted]w[redacted]d by 25wk [place] presenting for SOL/SROM @0030 . She plans on breast feeding. She request POPs for birth control. 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 -Hx of ASCUS  Past Medical History: Past Medical History:  Diagnosis Date   Anemia    Anemia in pregnancy 08/21/2019   Depression    Was on Zoloft   Hemorrhoids during pregnancy    Preterm labor    Supervision of high-risk pregnancy, third trimester 10/29/2019    Past Surgical History: Past Surgical History:  Procedure Laterality Date   WISDOM TOOTH EXTRACTION  2011    Obstetrical History: OB History     Gravida  5   Para  4   Term  3   Preterm  1   AB  0   Living  4      SAB  0   IAB  0   Ectopic  0   Multiple  0   Live Births  4           Social History Social History   Socioeconomic History   Marital status: Single    Spouse name: Not on file   Number of children: Not on file   Years of education: Not on file   Highest education level: Not on file  Occupational History   Not on file  Tobacco Use   Smoking status: Never   Smokeless tobacco: Never  Vaping Use   Vaping Use: Never used  Substance and Sexual Activity   Alcohol use: Not Currently    Comment: not since pregnant, but before that on weekends   Drug use: No   Sexual activity: Yes    Birth control/protection: None  Other Topics Concern   Not on file  Social History Narrative   Not on file   Social Determinants of Health   Financial Resource Strain: Not on file  Food Insecurity: No Food Insecurity (09/06/2022)   Hunger Vital Sign    Worried About Running Out of Food in the Last  Year: Never true    Ran Out of Food in the Last Year: Never true  Transportation Needs: No Transportation Needs (09/06/2022)   PRAPARE - Hydrologist (Medical): No    Lack of Transportation (Non-Medical): No  Physical Activity: Not on file  Stress: Not on file  Social Connections: Not on file    Family History: Family History  Problem Relation Age of Onset   Diabetes Mother    Hypertension Father    Diabetes Father    Asthma Sister    Asthma Brother    Heart disease Maternal Aunt    Diabetes Paternal Aunt    Hypertension Paternal Aunt    Cancer Maternal Grandmother    Hypertension Paternal Grandmother    Stroke Neg Hx     Allergies: No Known Allergies  Medications Prior to Admission  Medication Sig Dispense Refill Last Dose   acetaminophen (TYLENOL) 325 MG tablet Take 2 tablets (650 mg total) by mouth every 6 (six) hours as needed (for pain scale < 4). (Patient not taking: Reported on 01/15/2023) 90 tablet 0  AMBULATORY NON FORMULARY MEDICATION 1 Device by Other route once a week. Medication Name: Blood Pressure Cuff Monitored regularly at home ICD 10 O09.90 1 kit 0    cyclobenzaprine (FLEXERIL) 10 MG tablet Take 0.5-1 tablets (5-10 mg total) by mouth 3 (three) times daily as needed for muscle spasms. 30 tablet 3    Misc. Devices (GOJJI WEIGHT SCALE) MISC 1 Device by Does not apply route as needed. ICD 10 Z34.90 1 each 0    Prenatal MV & Min w/FA-DHA (PRENATAL ADULT GUMMY/DHA/FA) 0.4-25 MG CHEW Chew 1 tablet by mouth daily. 30 tablet 6    Prenatal Vit-Fe Fumarate-FA (PRENATAL VITAMIN PO) Take 1 tablet by mouth daily. (Patient not taking: Reported on 01/15/2023)        Review of Systems   All systems reviewed and negative except as stated in HPI  Blood pressure 125/71, pulse 72, temperature 99 F (37.2 C), temperature source Oral, resp. rate 20, last menstrual period 04/22/2022, SpO2 100 %, unknown if currently breastfeeding. General  appearance: alert and moderate distress Lungs: normal effort Heart: regular rate noted Abdomen: gravid Presentation: cephalic FHR: 948NIO Dilation: 10 Station: Plus 1 Exam by:: Youlanda Roys, RN   Prenatal labs: ABO, Rh: O/Positive/-- (10/05 1029) Antibody: Negative (10/05 1029) Rubella: 1.77 (10/05 1029) RPR: Non Reactive (12/05 0841)  HBsAg: Negative (10/05 1029)  HIV: Non Reactive (12/05 0841)  GBS: Negative/-- (01/09 1105)  1 hr Glucola 77 Genetic screening  LR, SMA and alpha thal carrier Anatomy US LVEF  Prenatal Transfer Tool  Maternal Diabetes: No Genetic Screening: Normal Maternal Ultrasounds/Referrals: Normal Fetal Ultrasounds or other Referrals:  None Maternal Substance Abuse:  No Significant Maternal Medications:  None Significant Maternal Lab Results:  None Number of Prenatal Visits:greater than 3 verified prenatal visits Other Comments:  None  Results for orders placed or performed during the hospital encounter of 01/17/23 (from the past 24 hour(s))  Fern Test   Collection Time: 01/17/23 12:35 AM  Result Value Ref Range   POCT Fern Test Positive = ruptured amniotic membanes     Patient Active Problem List   Diagnosis Date Noted   Indication for care in labor and delivery, antepartum 01/17/2023   Supervision of high risk pregnancy, antepartum 09/06/2022   History of preterm delivery, currently pregnant 09/06/2022   Anemia affecting pregnancy 08/21/2019    Assessment/Plan:  [name] is a 33 y.o. E7O3500 at [redacted]w[redacted]d here for SOL/SROM in MAU @0030 . Called by RN that patient was complete and coming to L&D. Hansel Feinstein present on my arrival actively delivering infant.    #Labor: Expectant management #Pain: Maternally supported #FWB: Cat I  #ID: GBS neg #MOF: Breast #MOC:POPs #Circ: n/a, girl  Reanna Scoggin Autry-Lott, DO  01/17/2023, 1:15 AM

## 2023-01-17 NOTE — Lactation Note (Signed)
This note was copied from a baby's chart. Lactation Consultation Note  Patient Name: [name] Today's Date: 01/17/2023 Reason for consult: Initial assessment;Early term 37-38.6wks Age:31 hours   LC Note:  Per RN, mother declined lactation services.   Maternal Data    Feeding    LATCH Score                    Lactation Tools Discussed/Used    Interventions    Discharge    Consult Status Consult Status: Complete (mother declined follow up)    Alanya Vukelich R Darrelle Barrell 01/17/2023, 6:07 AM

## 2023-01-17 NOTE — Discharge Summary (Signed)
Postpartum Discharge Summary    Patient Name: [name] DOB: April 26, 1990 MRN: [id number]  Date of admission: 01/17/2023 Delivery date:01/17/2023  Delivering provider: Gerlene Fee  Date of discharge: 01/18/2023  Admitting diagnosis: Indication for care in labor and delivery, antepartum [O75.9] Intrauterine pregnancy: [redacted]w[redacted]d     Secondary diagnosis:  Principal Problem:   Indication for care in labor and delivery, antepartum  Additional problems: None    Discharge diagnosis: Term Pregnancy Delivered                                              Post partum procedures: None Augmentation: N/A Complications: None  Hospital course: Onset of Labor With Vaginal Delivery      33 y.o. yo C1K4818 at [redacted]w[redacted]d was admitted in Active Labor on 01/17/2023. Labor course was complicated by precipitous delivery.  Membrane Rupture Time/Date: 12:30 AM ,01/17/2023   Delivery Method:Vaginal, Spontaneous  Episiotomy: None  Lacerations:  None  Patient had a postpartum course complicated by nothing.  She is ambulating, tolerating a regular diet, passing flatus, and urinating well. Patient is discharged home in stable condition on 01/18/23.  Newborn Data: Birth date:01/17/2023  Birth time:12:42 AM  Gender:Female  Living status:Living  Apgars:9 ,9  Weight:5 lb 12 oz (2.608 kg)   Magnesium Sulfate received: No BMZ received: No Rhophylac:No MMR:N/A T-DaP:Given prenatally Flu: No Transfusion:Yes  Physical exam  Vitals:   01/17/23 1523 01/17/23 1558 01/17/23 2133 01/18/23 0515  BP: 107/77 114/66 112/65 117/61  Pulse: 88 69 100 94  Resp: 19 18 20 16   Temp: 98 F (36.7 C) 98.2 F (36.8 C) 98.5 F (36.9 C) 98.1 F (36.7 C)  TempSrc: Oral Oral Oral Oral  SpO2:  100% 100% 100%  Weight:      Height:       General: alert, cooperative, and no distress Lochia: appropriate Uterine Fundus: firm Incision: N/A DVT Evaluation: No evidence of DVT seen on physical exam. Labs: Lab Results   Component Value Date   WBC 11.1 (H) 01/17/2023   HGB 9.7 (L) 01/17/2023   HCT 29.2 (L) 01/17/2023   MCV 83.0 01/17/2023   PLT 216 01/17/2023      Latest Ref Rng & Units 03/30/2019    2:37 PM  CMP  Glucose 70 - 99 mg/dL 91   BUN 6 - 20 mg/dL 7   Creatinine 0.44 - 1.00 mg/dL 0.59   Sodium 135 - 145 mmol/L 133   Potassium 3.5 - 5.1 mmol/L 3.5   Chloride 98 - 111 mmol/L 105   CO2 22 - 32 mmol/L 22   Calcium 8.9 - 10.3 mg/dL 8.5   Total Protein 6.5 - 8.1 g/dL 6.3   Total Bilirubin 0.3 - 1.2 mg/dL 0.6   Alkaline Phos 38 - 126 U/L 39   AST 15 - 41 U/L 19   ALT 0 - 44 U/L 18    Edinburgh Score:    01/17/2023    2:44 AM  Edinburgh Postnatal Depression Scale Screening Tool  I have been able to laugh and see the funny side of things. 0  I have looked forward with enjoyment to things. 0  I have blamed myself unnecessarily when things went wrong. 0  I have been anxious or worried for no good reason. 0  I have felt scared or panicky for no good reason. 0  Things have been getting  on top of me. 0  I have been so unhappy that I have had difficulty sleeping. 0  I have felt sad or miserable. 0  I have been so unhappy that I have been crying. 0  The thought of harming myself has occurred to me. 0  Edinburgh Postnatal Depression Scale Total 0   After visit meds:  Allergies as of 01/18/2023   No Known Allergies      Medication List     STOP taking these medications    cyclobenzaprine 10 MG tablet Commonly known as: FLEXERIL       TAKE these medications    acetaminophen 325 MG tablet Commonly known as: Tylenol Take 2 tablets (650 mg total) by mouth every 4 (four) hours as needed (for pain scale < 4).   Prenatal Adult Gummy/DHA/FA 0.4-25 MG Chew Chew 1 tablet by mouth daily.       Discharge home in stable condition Infant Feeding: Bottle and Breast Infant Disposition:home with mother Discharge instruction: per After Visit Summary and Postpartum booklet. Activity:  Advance as tolerated. Pelvic rest for 6 weeks.  Diet: routine diet Future Appointments: Future Appointments  Date Time Provider [place]  02/19/2023 11:15 AM Seabron Spates, CNM [facility] None   Follow up Visit: Message sent to [facility] by Autry-Lott on 01/18/2023  Please schedule this patient for a Virtual postpartum visit in 6 weeks with the following provider: Any provider. Additional Postpartum F/U: n/a   Low risk pregnancy complicated by:  Hx of PTD, precip del Delivery mode:  Vaginal, Spontaneous  Anticipated Birth Control:  POPs  01/18/2023 Gabriel Carina, CNM

## 2023-01-17 NOTE — MAU Note (Signed)
.  [name] is a 33 y.o. at [redacted]w[redacted]d here in MAU reporting: CTX all day yesterday with some pink tinged discharge. SROM at [place]. PT access called for pt having "urge to push". Pt assisted to room by Charge RN. EFM [place] was placed with FHR of 140. SVE was performed by Charge RN, 10/100/Plus1 Vertex. RN collected fern test. Agricultural consultant called for orders to [place], MD, and bed placement on Labor and delivery. Hansel Feinstein, CNM at bedside for transport.

## 2023-01-18 LAB — BIRTH TISSUE RECOVERY COLLECTION (PLACENTA DONATION)

## 2023-01-18 MED ORDER — ACETAMINOPHEN 325 MG PO TABS: 650.0000 mg | ORAL_TABLET | ORAL | 0 refills | Status: AC | PRN

## 2023-01-18 NOTE — Social Work (Signed)
MOB was referred for history of depression/anxiety. * Referral screened out by Clinical Social Worker because none of the following criteria appear to apply:  ~ History of anxiety/depression during this pregnancy, or of post-partum depression following prior delivery.  ~ Diagnosis of anxiety and/or depression within last 3 years OR * MOB's symptoms currently being treated with medication and/or therapy.   MOB depression dates back to 2016, Per OB records no concerns during pregnancy.  Please contact the Clinical Social Worker if needs arise, or by MOB request.  Letta Kocher, [place] Social Worker [phone]

## 2023-01-21 ENCOUNTER — Telehealth: Payer: Self-pay | Admitting: *Deleted

## 2023-01-21 NOTE — Patient Outreach (Signed)
Transitional Care Management Follow-up Telephone Call  [name] was discharged from [place] at [facility]), has or will have a pregnancy risk assessment at next visit, has a scheduled  Post Partum  appointment with the [facility] provider on 02/19/23 @ 11:15am, and has been referred for appropriate case management and ongoing follow up.   Lurena Joiner RN, BSN [place]  [employer] manager

## 2023-01-22 ENCOUNTER — Encounter: Payer: Medicaid Other | Admitting: Advanced Practice Midwife

## 2023-01-25 ENCOUNTER — Telehealth (HOSPITAL_COMMUNITY): Payer: Self-pay | Admitting: *Deleted

## 2023-01-25 NOTE — Telephone Encounter (Signed)
Mom reports feeling good. No concerns about herself at this time. EPDS not completed, but mom reports feeling good emotionally. [facility] score=0) Mom reports baby is doing well. Feeding, peeing, and pooping without difficulty. Mom reports no concerns about baby at present.  Odis Hollingshead, RN 01-25-2023 at 11:53am

## 2023-01-29 ENCOUNTER — Encounter: Payer: Medicaid Other | Admitting: Advanced Practice Midwife

## 2023-02-19 ENCOUNTER — Ambulatory Visit (INDEPENDENT_AMBULATORY_CARE_PROVIDER_SITE_OTHER): Payer: Medicaid Other | Admitting: Advanced Practice Midwife

## 2023-02-19 ENCOUNTER — Encounter: Payer: Self-pay | Admitting: Advanced Practice Midwife

## 2023-02-19 MED ORDER — NORETHINDRONE 0.35 MG PO TABS: 1.0000 | ORAL_TABLET | Freq: Every day | ORAL | 11 refills | Status: DC

## 2023-02-19 NOTE — Progress Notes (Signed)
    Post Partum Visit Note  [name] is a 33 y.o. G20P4105 female who presents for a postpartum visit. She is 4 weeks postpartum following a normal spontaneous vaginal delivery.  I have fully reviewed the prenatal and intrapartum course. The delivery was at 37.4 gestational weeks.  Anesthesia: none. Postpartum course has been uneventful. Baby is doing well. Baby is feeding by both breast and bottle- similac Neosure. Bleeding no bleeding. Bowel function is normal. Bladder function is normal. Patient is sexually active. Contraception method is oral progesterone-only contraceptive. Postpartum depression screening: negative.   The pregnancy intention screening data noted above was reviewed. Potential methods of contraception were discussed. The patient elected to proceed with No data recorded.    Health Maintenance Due  Topic Date Due   COVID-19 Vaccine (1) Never done   INFLUENZA VACCINE  07/31/2022    The following portions of the patient's history were reviewed and updated as appropriate: allergies, current medications, past family history, past medical history, past social history, past surgical history, and problem list.  Review of Systems Pertinent items noted in HPI and remainder of comprehensive ROS otherwise negative.  Objective:  There were no vitals taken for this visit.   General:  alert, cooperative, and no distress   Breasts:  normal  Lungs: clear to auscultation bilaterally  Heart:  regular rate and rhythm, S1, S2 normal, no murmur, click, rub or gallop  Abdomen: soft, non-tender; bowel sounds normal; no masses,  no organomegaly   Wound N/a  GU exam:  not indicated       Assessment:  Normal  postpartum exam.  History of LGSIL pap followed by normal pap in October 2023 (10/04/22)  Plan:   Essential components of care per ACOG recommendations:  1.  Mood and well being: Patient with negative depression screening today. Reviewed local resources for support.  -  Patient tobacco use? No.   - hx of drug use? No.    2. Infant care and feeding:  -Patient currently breastmilk feeding? Yes. Reviewed importance of draining breast regularly to support lactation.  -Social determinants of health (SDOH) reviewed in EPIC. No concerns  3. Sexuality, contraception and birth spacing - Patient does not want a pregnancy in the next year.   - Reviewed reproductive life planning. Reviewed contraceptive methods based on pt preferences and effectiveness.  Patient desired Oral Contraceptive today.  POPs until weaned - Discussed birth spacing of 18 months  4. Sleep and fatigue -Encouraged family/partner/community support of 4 hrs of uninterrupted sleep to help with mood and fatigue  5. Physical Recovery  - Discussed patients delivery and complications. She describes her labor as good. - Patient had a Vaginal, no problems at delivery. Patient had a  no  laceration. Perineal healing reviewed. Patient expressed understanding - Patient has urinary incontinence? No. - Patient is safe to resume physical and sexual activity  6.  Health Maintenance - HM due items addressed No -   - Last pap smear  Diagnosis  Date Value Ref Range Status  10/04/2022   Final   - Negative for intraepithelial lesion or malignancy (NILM)   Pap smear not done at today's visit.  -Breast Cancer screening indicated? No.   7. Chronic Disease/Pregnancy Condition follow up: None  - PCP follow up  Kathrene Alu, [place] for [employer], [place]   Seabron Spates, [place]

## 2023-03-13 ENCOUNTER — Other Ambulatory Visit: Payer: Self-pay | Admitting: Advanced Practice Midwife

## 2023-03-13 MED ORDER — SLYND 4 MG PO TABS: 1.0000 | ORAL_TABLET | Freq: Every day | ORAL | 12 refills | Status: DC

## 2023-03-13 NOTE — Progress Notes (Signed)
Pharmacy does not have her Rx for OCPs New Rx for [facility] sent  Seabron Spates, CNM

## 2023-03-14 ENCOUNTER — Telehealth: Payer: Self-pay

## 2023-03-14 NOTE — Telephone Encounter (Signed)
-----   Message from Seabron Spates, CNM sent at 03/13/2023  9:00 PM EDT ----- Regarding: RE: Birth control pills She had a Rx for [place] in the system but not sure why pharmacy didn't fill it  I sent in a new Rx for Slynd OCPs (safe for breastfeeding)  Can you notify her?  Thanks  Lelan Pons ----- Message ----- From: Valentina Lucks, CMA Sent: 03/13/2023   3:14 PM EDT To: Seabron Spates, CNM Subject: Birth control pills                            This patient states she was supposed to have a Rx for birth control sent to her pharmacy.

## 2023-03-14 NOTE — Telephone Encounter (Signed)
Called patient to inform her that Slynd OCPs was sent to her pharmacy. Understanding was voiced. Zaide Kardell l Shaul Trautman, CMA

## 2023-07-01 ENCOUNTER — Encounter: Payer: Self-pay | Admitting: Advanced Practice Midwife

## 2023-07-03 MED ORDER — NORETHINDRONE 0.35 MG PO TABS: 1.0000 | ORAL_TABLET | Freq: Every day | ORAL | 3 refills | Status: AC

## 2023-07-03 NOTE — Addendum Note (Signed)
Addended by: Levie Heritage on: 07/03/2023 03:33 PM   Modules accepted: Orders

## 2024-09-01 DIAGNOSIS — H5213 Myopia, bilateral: Secondary | ICD-10-CM | POA: Diagnosis not present

## 2024-09-16 DIAGNOSIS — H5213 Myopia, bilateral: Secondary | ICD-10-CM | POA: Diagnosis not present
